# Patient Record
Sex: Female | Born: 1985 | Race: Black or African American | Hispanic: No | Marital: Single | Smoking: Current every day smoker
Health system: Southern US, Community
[De-identification: ages and names within clinical notes are randomized; demographics above are authoritative.]

## PROBLEM LIST (undated history)

## (undated) DIAGNOSIS — I509 Heart failure, unspecified: Secondary | ICD-10-CM

## (undated) DIAGNOSIS — G4730 Sleep apnea, unspecified: Secondary | ICD-10-CM

## (undated) DIAGNOSIS — I10 Essential (primary) hypertension: Secondary | ICD-10-CM

---

## 2009-12-05 ENCOUNTER — Emergency Department (HOSPITAL_COMMUNITY): Admission: EM | Admit: 2009-12-05 | Discharge: 2009-12-05 | Payer: Self-pay | Admitting: Emergency Medicine

## 2010-04-27 ENCOUNTER — Inpatient Hospital Stay (HOSPITAL_COMMUNITY): Admission: EM | Admit: 2010-04-27 | Discharge: 2010-04-30 | Payer: Self-pay | Source: Home / Self Care

## 2010-04-28 LAB — URINALYSIS, ROUTINE W REFLEX MICROSCOPIC
Ketones, ur: NEGATIVE mg/dL
Protein, ur: NEGATIVE mg/dL
Urobilinogen, UA: 1 mg/dL (ref 0.0–1.0)

## 2010-04-28 LAB — WET PREP, GENITAL
Trich, Wet Prep: NONE SEEN
Yeast Wet Prep HPF POC: NONE SEEN

## 2010-04-28 LAB — DIFFERENTIAL
Basophils Absolute: 0.1 10*3/uL (ref 0.0–0.1)
Basophils Absolute: 0 10*3/uL (ref 0.0–0.1)
Basophils Relative: 0 % (ref 0–1)
Basophils Relative: 0 % (ref 0–1)
Basophils Relative: 0 % (ref 0–1)
Eosinophils Relative: 0 % (ref 0–5)
Eosinophils Relative: 1 % (ref 0–5)
Lymphocytes Relative: 5 % — ABNORMAL LOW (ref 12–46)
Lymphocytes Relative: 12 % (ref 12–46)
Lymphocytes Relative: 14 % (ref 12–46)
Monocytes Absolute: 1.9 10*3/uL — ABNORMAL HIGH (ref 0.1–1.0)
Monocytes Absolute: 1 10*3/uL (ref 0.1–1.0)
Monocytes Relative: 5 % (ref 3–12)
Neutro Abs: 24.5 10*3/uL — ABNORMAL HIGH (ref 1.7–7.7)
Neutro Abs: 17.9 10*3/uL — ABNORMAL HIGH (ref 1.7–7.7)
Neutrophils Relative %: 90 % — ABNORMAL HIGH (ref 43–77)
Neutrophils Relative %: 81 % — ABNORMAL HIGH (ref 43–77)
WBC Morphology: INCREASED

## 2010-04-28 LAB — COMPREHENSIVE METABOLIC PANEL
AST: 14 U/L (ref 0–37)
Alkaline Phosphatase: 52 U/L (ref 39–117)
BUN: 9 mg/dL (ref 6–23)
CO2: 26 mEq/L (ref 19–32)
Chloride: 99 mEq/L (ref 96–112)
Creatinine, Ser: 1.09 mg/dL (ref 0.4–1.2)
GFR calc non Af Amer: >60 mL/min (ref >60)
Potassium: 3.5 mEq/L (ref 3.5–5.1)
Total Bilirubin: 1.1 mg/dL (ref 0.3–1.2)

## 2010-04-28 LAB — HIV ANTIBODY (ROUTINE TESTING W REFLEX): HIV: NONREACTIVE

## 2010-04-28 LAB — BASIC METABOLIC PANEL
Calcium: 8.5 mg/dL (ref 8.4–10.5)
Creatinine, Ser: 0.99 mg/dL (ref 0.4–1.2)
GFR calc Af Amer: >60 mL/min (ref >60)
GFR calc non Af Amer: >60 mL/min (ref >60)
Sodium: 141 mEq/L (ref 135–145)

## 2010-04-28 LAB — CBC
Hemoglobin: 13.8 g/dL (ref 12.0–15.0)
Hemoglobin: 12.3 g/dL (ref 12.0–15.0)
Hemoglobin: 12.9 g/dL (ref 12.0–15.0)
MCH: 27.2 pg (ref 26.0–34.0)
MCH: 27.2 pg (ref 26.0–34.0)
MCHC: 33.9 g/dL (ref 30.0–36.0)
MCV: 79.5 fL (ref 78.0–100.0)
Platelets: 188 10*3/uL (ref 150–400)
RBC: 5.08 MIL/uL (ref 3.87–5.11)
RBC: 4.52 MIL/uL (ref 3.87–5.11)
RDW: 15 % (ref 11.5–15.5)
WBC: 26.5 10*3/uL — ABNORMAL HIGH (ref 4.0–10.5)
WBC: 22.4 10*3/uL — ABNORMAL HIGH (ref 4.0–10.5)

## 2010-04-28 LAB — PREGNANCY, URINE: Preg Test, Ur: NEGATIVE

## 2010-04-28 LAB — GLUCOSE, CAPILLARY: Glucose-Capillary: 109 mg/dL — ABNORMAL HIGH (ref 70–99)

## 2010-04-28 LAB — URINE MICROSCOPIC-ADD ON

## 2010-04-29 LAB — DIFFERENTIAL
Basophils Relative: 0 % (ref 0–1)
Eosinophils Absolute: 0.4 10*3/uL (ref 0.0–0.7)
Neutro Abs: 14.1 10*3/uL — ABNORMAL HIGH (ref 1.7–7.7)
Neutrophils Relative %: 76 % (ref 43–77)

## 2010-04-29 LAB — GC/CHLAMYDIA PROBE AMP, GENITAL
Chlamydia, DNA Probe: POSITIVE — AB
GC Probe Amp, Genital: POSITIVE — AB

## 2010-04-29 LAB — URINE CULTURE

## 2010-04-29 LAB — CBC
Hemoglobin: 12.3 g/dL (ref 12.0–15.0)
Platelets: 180 10*3/uL (ref 150–400)
RBC: 4.59 MIL/uL (ref 3.87–5.11)
WBC: 18.5 10*3/uL — ABNORMAL HIGH (ref 4.0–10.5)

## 2010-04-30 LAB — DIFFERENTIAL
Eosinophils Relative: 3 % (ref 0–5)
Lymphocytes Relative: 21 % (ref 12–46)
Lymphs Abs: 2.5 10*3/uL (ref 0.7–4.0)
Monocytes Absolute: 0.8 10*3/uL (ref 0.1–1.0)
Monocytes Relative: 7 % (ref 3–12)

## 2010-04-30 LAB — CBC
Platelets: 201 10*3/uL (ref 150–400)
RBC: 4.53 MIL/uL (ref 3.87–5.11)
RDW: 14.9 % (ref 11.5–15.5)
WBC: 11.9 10*3/uL — ABNORMAL HIGH (ref 4.0–10.5)

## 2010-05-02 LAB — CULTURE, BLOOD (ROUTINE X 2): Culture: NO GROWTH

## 2010-05-08 NOTE — Discharge Summary (Signed)
NAMECRISSY, Samantha Mccarthy NO.:  [id number]  MEDICAL RECORD NO.:  [id number]          PATIENT TYPE:  INP  LOCATION:  A305                          FACILITY:  [facility]  PHYSICIAN:  Pleas Koch, MD        DATE OF BIRTH:  Dec 24, 1985  DATE OF ADMISSION:  04/27/2010 DATE OF DISCHARGE:  01/27/2012LH                              DISCHARGE SUMMARY   PRIMARY CARE PHYSICIAN:  [facility] Department.  DISCHARGE DIAGNOSES: 1. Gonococcal and chlamydial cervicitis. 2. Hypertension. 3. Morbid obesity. 4. Metabolic syndrome. 5. Uncontrolled hypertension.  DISCHARGE MEDICATIONS:  Remains same and are as follows: 1. Aleve 220 mg 1 tablet p.o. p.r.n. 2. Ibuprofen 800 mg 1 tablet q.6-8 p.r.n. for pain. 3. Lisinopril doses changed from 10-20 mg p.o. daily.  BRIEF HISTORY OF PRESENT COMPLAINTS:  The patient is a 25 year old African American female, morbidly obese, history of hypertension who came to the [facility] when she noted 3 days of lower right abdominal pain, pain was 7-8 in intensity on 10 associated with fever and chills.  She had multiple episodes of emesis with liquid emesis.  No blood.  Denies any diarrhea.  Denies any dysuria.  Did not admit any vaginal discharge.  She has occasional cough, did not radiate anywhere. She did have unprotected sex weekend before admission.  The patient stated to admitting physician in the past that she has had prior SDI.  Smokes 2-3 cigarettes on a daily basis.  No alcohol use, currently is unemployed.  It was noted on exam initially that she had tenderness in the abdomen, more in right lower quadrant.  No rebound, guarding, or rigidity. Pelvic done by ED physician showed a lot of pus in the cervix.  Dr. Deretha Emory count not elicit cervical motion tenderness.  The patient received cefoxitin 1 g in the ED and then 3 separate doses after that. The patient was started on cefoxitin and doxycycline as  previously mentioned.  Wet prep was done, which showed few Trichomonas.  Her gonorrhea, Chlamydia came back as positive and her HIV test that was done came back negative.  Hypertension:  This was moderately controlled while in hospital; however, she became acutely upset on the day prior to discharge and needed labetalol x1.  As such, we have increased her medications from lisinopril 10 to lisinopril 20 p.o. daily.  The patient is understanding of same and will take this.  Obstructive sleep apnea:  On the day of discharge, the patient was noted to be snoring.  May need an outpatient followup for sleep study.  I will have [facility] Department update this.  Morbid obesity:  She will need to be counseled about nutrition, since this may prevent her from having such high blood pressure and obstructive sleep apnea.  Her white count was initially elevated at around 30; however, trended down from day of admission to 11.9 on the day of discharge.  VITALS ON DISCHARGE:  Temperature 97.8, pulse 80, respirations 16, blood pressures 179-180/118-133.  She was satting to 97% on room air and did not have any significant complaints.  Please note that Health Department  will need to be notified about her gonorrhea and Chlamydia positive state.                                           ______________________________ Pleas Koch, MD     JS/MEDQ  D:  04/30/2010  T:  04/30/2010  Job:  [id number]  Electronically Signed by Pleas Koch MD on 05/08/2010 06:41:29 PM

## 2010-05-09 NOTE — H&P (Signed)
Samantha Mccarthy, NEGRETTE NO.:  [id number]  MEDICAL RECORD NO.:  [id number]          PATIENT TYPE:  EMS  LOCATION:  ED                            FACILITY:  [facility]  PHYSICIAN:  Osvaldo Shipper, MD     DATE OF BIRTH:  Jan 28, 1986  DATE OF ADMISSION:  04/27/2010 DATE OF DISCHARGE:  LH                             HISTORY & PHYSICAL   PRIMARY CARE PHYSICIAN:  A physician at the [facility] Department, although she has not seen her providers in over a year.  ADMISSION DIAGNOSES: 1. Pelvic inflammatory disease. 2. Morbid obesity. 3. History of hypertension.  CHIEF COMPLAINT:  Abdominal pain, nausea and vomiting since 3 days.  HISTORY OF PRESENT ILLNESS:  The patient is a 25 year old African American female who is morbidly obese who has a history of hypertension, who was in her usual state of health until about 3 days ago when she started noticing pain in the lower part of her abdomen towards the rightside.  The pain was 7-8/10 in intensity, associated with fever, chills. She had multiple episodes of emesis with liquid emesis.  No blood. Denies any diarrhea.  Denies any dysuria.  She did not admit to any vaginal discharge to me.  She said she her friend's baby was sick with fever recently.  She does get occasional cough which is dry.  The pain did not really radiate anywhere, stays in the right lower quadrant. There were no precipitating, aggravating or relieving factors identified.  She did tell me that over the last weekend, she had a sexual encounter and had unprotected sex.  MEDICATIONS AT HOME:  Lisinopril 10 mg daily.  ALLERGIES:  Include CODEINE, LORTAB, and TYLENOL which cause hives, rashes, swelling, and itching.  PAST MEDICAL HISTORY:  Positive for hypertension.  She is also morbidly obese.  She said she had similar complaints when she was 25 years old, which was treated with amoxicillin and some pain medicine.  She admits to having unprotected  sex once in awhile, although she cannot remember the previous encounter prior to the last one.  SOCIAL HISTORY:  She lives in [place] with her aunt.  She has never been married, does not have any children.  Smokes 2-3 cigarettes on a daily basis.  No alcohol use.  No illicit drug use.  She is currently unemployed.  FAMILY HISTORY:  Father has some heart disease, which is unknown. Mother died of unknown cancer.  There is also a history of diabetes and hypertension in the family.  REVIEW OF SYSTEMS:  GENERAL: Positive for weakness, malaise.  HEENT: Unremarkable.  CARDIOVASCULAR:  Unremarkable.  RESPIRATORY:  As in HPI. GI: As in HPI.  GU: As in HPI.  NEUROLOGIC: Unremarkable.  PSYCHIATRIC: Unremarkable.  DERMATOLOGIC:  Unremarkable.  Other systems reviewed and found to be negative.  PHYSICAL EXAMINATION:  VITAL SIGNS: Temperature initially here was 100.1, subsequently has been 99.4, blood pressure 143/82.  Heart rate initially was 105, subsequently 86, respiratory rate 20, saturation 97% on room air. GENERAL:  A morbidly obese African American female in no distress. HEENT: Head is normocephalic, atraumatic.  Pupils are equal, reacting.  No pallor, no icterus.  Oral mucous membranes are moist.  There are no oral lesions noted. NECK:  Soft and supple.  No thyromegaly is appreciated.  No cervical, supraclavicular, or inguinal lymphadenopathy is present. LUNGS:  Anteriorly clear to auscultation bilaterally with no wheezing, rales or rhonchi. CARDIOVASCULAR:  S1 and S2 is normal.  Regular.  No S3-S4, rubs, murmurs, or bruits.  No pedal edema.  ABDOMEN:  Soft, obese.  There is tenderness in the lower abdomen, more in the right lower quadrant as well as in the suprapubic area.  There is no rebound, rigidity, or guarding. PELVIC:  Done by the ED physician, which showed a lot of pus coming from her cervix.  Dr. Deretha Emory could not elicit cervical motion tenderness. However, the patient was  already medicated by that time. MUSCULOSKELETAL:  Normal muscle mass and tone. NEUROLOGICALLY:  She is alert, somewhat drowsy from the pain medicine but easily arousable without any focal neurological deficits.  No skin rashes are present.  LAB DATA:  Sodium is 134, potassium 3.5, glucose 108, BUN and creatinine normal.  LFTs are normal.  Lipase is normal.  White cell count was 26,500 with 90% neutrophils, hemoglobin 13.8, platelet count 201.  UA did not show any infection.  A urine pregnancy test was negative.  She had a chest x-ray which showed borderline cardiomegaly without any active disease.  A CAT scan of the abdomen/pelvis was also done which showed nonspecific mild stranding of the fat plains in the anterior pelvis of uncertain etiology.  Some scattered normal/upper-normal size mesenteric and pelvic lymph nodes were also seen.  No other acute process was identified.  ASSESSMENT:  This is 25 year old African American female who is morbidly obese, has hypertension, who presents with abdominal pain, fever, and leukocytosis.  It appears the patient may have pelvic inflammatory disease.  She has been having a lot of vomiting and nausea, so she needs to be admitted at least initially to get IV antibiotics and to control her symptoms.  PLAN: 1. Pelvic inflammatory disease based on [facility] Antibiotic     Guide.  The inpatient treatment for this consists of cefoxitin     intravenously and doxycycline either intravenously or orally.  We     will initiate these treatments.  The wet screen has been done, and     cultures from the pelvic area will be sent off and will be followed     up on.  I have gone over the perils of unprotected sex with this     patient.  She did tell me that she has had HIV testing done in the     past, and it has been negative.  Because she has had unprotected     sex, I am going to order another HIV test here.  The patient may     benefit from seeing  her gynecologist as an outpatient, which can be     explained to her when she is ready for discharge. 2. We will give her Motrin for fever as needed.  Give her analgesics     narcotics as well.  Labs will be rechecked in the morning to make     sure the white cell count is improving. 3. Nausea, vomiting is likely from her acute illness and will be     treated symptomatically.  Further management decisions will depend on results of further testing and patient's response to treatment.  Osvaldo Shipper, MD     GK/MEDQ  D:  04/27/2010  T:  04/27/2010  Job:  [id number]  cc:   [facility] Dept.  Electronically Signed by Osvaldo Shipper MD on 05/09/2010 05:07:57 PM

## 2010-06-17 LAB — BASIC METABOLIC PANEL
Chloride: 104 mEq/L (ref 96–112)
GFR calc non Af Amer: >60 mL/min (ref >60)
Potassium: 3.5 mEq/L (ref 3.5–5.1)
Sodium: 137 mEq/L (ref 135–145)

## 2010-06-17 LAB — URINALYSIS, ROUTINE W REFLEX MICROSCOPIC
Bilirubin Urine: NEGATIVE
Glucose, UA: NEGATIVE mg/dL
Leukocytes, UA: NEGATIVE
Specific Gravity, Urine: 1.025 (ref 1.005–1.030)
Urobilinogen, UA: 0.2 mg/dL (ref 0.0–1.0)
pH: 5.5 (ref 5.0–8.0)

## 2010-06-17 LAB — CBC
HCT: 37.6 % (ref 36.0–46.0)
Hemoglobin: 12.3 g/dL (ref 12.0–15.0)
MCV: 82 fL (ref 78.0–100.0)
RBC: 4.59 MIL/uL (ref 3.87–5.11)
WBC: 9.1 10*3/uL (ref 4.0–10.5)

## 2010-06-17 LAB — DIFFERENTIAL
Basophils Absolute: 0.1 10*3/uL (ref 0.0–0.1)
Lymphocytes Relative: 35 % (ref 12–46)
Lymphs Abs: 3.2 10*3/uL (ref 0.7–4.0)
Monocytes Absolute: 0.4 10*3/uL (ref 0.1–1.0)
Monocytes Relative: 5 % (ref 3–12)
Neutro Abs: 5.1 10*3/uL (ref 1.7–7.7)

## 2010-06-17 LAB — PREGNANCY, URINE: Preg Test, Ur: NEGATIVE

## 2010-06-17 LAB — URINE MICROSCOPIC-ADD ON

## 2011-02-09 ENCOUNTER — Encounter: Payer: Self-pay | Admitting: Emergency Medicine

## 2011-02-09 ENCOUNTER — Emergency Department (HOSPITAL_COMMUNITY)
Admission: EM | Admit: 2011-02-09 | Discharge: 2011-02-09 | Disposition: A | Discharge status: Home / Self Care | Payer: Medicaid Other | Attending: Emergency Medicine | Admitting: Emergency Medicine

## 2011-02-09 DIAGNOSIS — Z91199 Patient's noncompliance with other medical treatment and regimen due to unspecified reason: Secondary | ICD-10-CM | POA: Insufficient documentation

## 2011-02-09 DIAGNOSIS — I10 Essential (primary) hypertension: Secondary | ICD-10-CM | POA: Insufficient documentation

## 2011-02-09 DIAGNOSIS — L259 Unspecified contact dermatitis, unspecified cause: Secondary | ICD-10-CM | POA: Insufficient documentation

## 2011-02-09 DIAGNOSIS — Z9114 Patient's other noncompliance with medication regimen: Secondary | ICD-10-CM

## 2011-02-09 DIAGNOSIS — R21 Rash and other nonspecific skin eruption: Secondary | ICD-10-CM | POA: Insufficient documentation

## 2011-02-09 DIAGNOSIS — Z9119 Patient's noncompliance with other medical treatment and regimen: Secondary | ICD-10-CM | POA: Insufficient documentation

## 2011-02-09 DIAGNOSIS — L0100 Impetigo, unspecified: Secondary | ICD-10-CM

## 2011-02-09 DIAGNOSIS — L239 Allergic contact dermatitis, unspecified cause: Secondary | ICD-10-CM

## 2011-02-09 HISTORY — DX: Essential (primary) hypertension: I10

## 2011-02-09 LAB — POCT PREGNANCY, URINE: Preg Test, Ur: NEGATIVE

## 2011-02-09 MED ORDER — DEXAMETHASONE SODIUM PHOSPHATE 10 MG/ML IJ SOLN
10.0000 mg | Freq: Once | INTRAMUSCULAR | Status: AC
  Administered 2011-02-09: 10 mg via INTRAMUSCULAR

## 2011-02-09 MED ORDER — PREDNISONE 10 MG PO TABS: ORAL_TABLET | ORAL | Status: DC

## 2011-02-09 MED ORDER — DIPHENHYDRAMINE HCL 50 MG PO CAPS: 50.0000 mg | ORAL_CAPSULE | Freq: Four times a day (QID) | ORAL | Status: AC | PRN

## 2011-02-09 MED ORDER — CEPHALEXIN 500 MG PO CAPS: 500.0000 mg | ORAL_CAPSULE | Freq: Four times a day (QID) | ORAL | Status: AC

## 2011-02-09 MED ORDER — DEXAMETHASONE SODIUM PHOSPHATE 10 MG/ML IJ SOLN
10.0000 mg | Freq: Once | INTRAMUSCULAR | Status: DC
  Filled 2011-02-09: qty 1

## 2011-02-09 MED ORDER — DIPHENHYDRAMINE HCL 25 MG PO CAPS
50.0000 mg | ORAL_CAPSULE | Freq: Once | ORAL | Status: AC
  Administered 2011-02-09: 50 mg via ORAL
  Filled 2011-02-09: qty 2

## 2011-02-09 MED ORDER — CEPHALEXIN 500 MG PO CAPS
500.0000 mg | ORAL_CAPSULE | Freq: Once | ORAL | Status: AC
  Administered 2011-02-09: 500 mg via ORAL
  Filled 2011-02-09: qty 1

## 2011-02-09 MED ORDER — LISINOPRIL 10 MG PO TABS: 10.0000 mg | ORAL_TABLET | Freq: Every day | ORAL | Status: DC

## 2011-02-09 NOTE — ED Provider Notes (Signed)
History     CSN: [id number] Arrival date & time: 02/09/2011  8:41 AM   First MD Initiated Contact with Patient 02/09/11 [phone]      Chief Complaint  Patient presents with  . Rash    (Consider location/radiation/quality/duration/timing/severity/associated sxs/prior treatment) HPI Comments: Patient also reports ran out of her lisinopril about 2 months ago.  Has seen [facility] for medical care,  But has not followed up with them.  She denies dizziness,  Headache,  Sob,  Chest pain.  Patient is a 25 y.o. female presenting with rash. The history is provided by the patient.  Rash  This is a new problem. Episode onset: 1 week. The problem has been gradually worsening. The problem is associated with a new detergent/soap (She switched from dove unscented to dial last week,  has since switched back when the rash started.). There has been no fever. The rash is present on the groin, left buttock and right buttock (right face and neck.  Wrists and forearm,  few scattered patches on abdomen and back.). The pain is at a severity of 0/10. The patient is experiencing no pain. The pain has been constant since onset. Associated symptoms include itching and weeping. She has tried nothing for the symptoms.    Past Medical History  Diagnosis Date  . Hypertension     History reviewed. No pertinent past surgical history.  Family History  Problem Relation Age of Onset  . Cancer Mother   . Heart failure Father   . Diabetes Other     History  Substance Use Topics  . Smoking status: Current Everyday Smoker -- 0.5 packs/day for 7 years    Types: Cigarettes  . Smokeless tobacco: Never Used  . Alcohol Use: Yes     Occasionally    OB History    Grav Para Term Preterm Abortions TAB SAB Ect Mult Living            0      Review of Systems  Constitutional: Negative for fever.  HENT: Negative for congestion, sore throat and neck pain.   Eyes: Negative.   Respiratory: Negative for chest tightness  and shortness of breath.   Cardiovascular: Negative for chest pain.  Gastrointestinal: Negative for nausea and abdominal pain.  Genitourinary: Negative.   Musculoskeletal: Negative for joint swelling and arthralgias.  Skin: Positive for itching and rash. Negative for wound.  Neurological: Negative for dizziness, weakness, light-headedness, numbness and headaches.  Hematological: Negative.   Psychiatric/Behavioral: Negative.     Allergies  Codeine; Hydrocodone-acetaminophen; and Tylenol  Home Medications   Current Outpatient Rx  Name Route Sig Dispense Refill  . IBUPROFEN 200 MG PO TABS Oral Take 800 mg by mouth every 6 (six) hours as needed. For pain       BP 173/127  Pulse 77  Temp(Src) 98.8 F (37.1 C) (Oral)  Resp 16  Ht 5\' 6"  (1.676 m)  Wt 350 lb (158.759 kg)  BMI 56.49 kg/m2  SpO2 100%  LMP 01/02/2011  Physical Exam  Nursing note and vitals reviewed. Constitutional: She is oriented to person, place, and time.       Morbidly obese  HENT:  Head: Normocephalic and atraumatic.  Eyes: Conjunctivae are normal.  Neck: Normal range of motion.  Cardiovascular: Normal rate, regular rhythm, normal heart sounds and intact distal pulses.   Pulmonary/Chest: Effort normal and breath sounds normal. She has no wheezes.  Abdominal: Soft. Bowel sounds are normal. There is no tenderness.  Musculoskeletal: Normal  range of motion.  Neurological: She is alert and oriented to person, place, and time.  Skin: Skin is warm and dry. Rash noted. Rash is maculopapular.       Scattered,  Excoriated maculopapular patches on buttocks, more linear on posterior buttock folds,  Anterior groin,  Bilateral wrists and right cheek and face.  Clear yellow drainage from buttock and groin lesions.  No surrounding erythema.  Significant excoriations noted.  Psychiatric: She has a normal mood and affect.    ED Course  Procedures (including critical care time)  Labs Reviewed - No data to display No  results found.   No diagnosis found.    MDM  Allergic dermatitis with superimposed impetigo.  Hypertension with noncompliance.  Prednisone taper,  Benadryl,  Keflex,  Refilled lisinopril.  Patient strongly encouraged to f/u with health dept for bp issues,  Primary care.          Candis Musa, PA 02/09/11 1000

## 2011-02-09 NOTE — ED Notes (Signed)
Patient c/o rash to face, groin,  and legs x1 week. Patient reports rash itching. Patient reports bleeding and yellow drainage after scratching areas.

## 2011-02-10 NOTE — ED Provider Notes (Signed)
Medical screening examination/treatment/procedure(s) were performed by non-physician practitioner and as supervising physician I was immediately available for consultation/collaboration.  Tarez Bowns S. Eulanda Dorion, MD 02/10/11 0720 

## 2013-06-30 ENCOUNTER — Emergency Department (HOSPITAL_COMMUNITY)
Admission: EM | Admit: 2013-06-30 | Discharge: 2013-06-30 | Disposition: A | Discharge status: Home / Self Care | Payer: Self-pay | Attending: Emergency Medicine | Admitting: Emergency Medicine

## 2013-06-30 ENCOUNTER — Emergency Department (HOSPITAL_COMMUNITY): Payer: Medicaid Other

## 2013-06-30 ENCOUNTER — Encounter (HOSPITAL_COMMUNITY): Payer: Self-pay | Admitting: Emergency Medicine

## 2013-06-30 DIAGNOSIS — I10 Essential (primary) hypertension: Secondary | ICD-10-CM | POA: Insufficient documentation

## 2013-06-30 DIAGNOSIS — R05 Cough: Secondary | ICD-10-CM | POA: Insufficient documentation

## 2013-06-30 DIAGNOSIS — Z79899 Other long term (current) drug therapy: Secondary | ICD-10-CM | POA: Insufficient documentation

## 2013-06-30 DIAGNOSIS — N390 Urinary tract infection, site not specified: Secondary | ICD-10-CM | POA: Insufficient documentation

## 2013-06-30 DIAGNOSIS — F17200 Nicotine dependence, unspecified, uncomplicated: Secondary | ICD-10-CM | POA: Insufficient documentation

## 2013-06-30 DIAGNOSIS — R059 Cough, unspecified: Secondary | ICD-10-CM | POA: Insufficient documentation

## 2013-06-30 DIAGNOSIS — Z3202 Encounter for pregnancy test, result negative: Secondary | ICD-10-CM | POA: Insufficient documentation

## 2013-06-30 LAB — CBC WITH DIFFERENTIAL/PLATELET
BAND NEUTROPHILS: 0 % (ref 0–10)
BASOS ABS: 0 10*3/uL (ref 0.0–0.1)
BASOS PCT: 0 % (ref 0–1)
Blasts: 0 %
Eosinophils Absolute: 0 10*3/uL (ref 0.0–0.7)
Eosinophils Relative: 0 % (ref 0–5)
HEMATOCRIT: 38.8 % (ref 36.0–46.0)
HEMOGLOBIN: 12.5 g/dL (ref 12.0–15.0)
Lymphocytes Relative: 52 % — ABNORMAL HIGH (ref 12–46)
Lymphs Abs: 4.7 10*3/uL — ABNORMAL HIGH (ref 0.7–4.0)
MCH: 25.8 pg — AB (ref 26.0–34.0)
MCHC: 32.2 g/dL (ref 30.0–36.0)
MCV: 80.2 fL (ref 78.0–100.0)
MYELOCYTES: 0 %
Metamyelocytes Relative: 0 %
Monocytes Absolute: 0.1 10*3/uL (ref 0.1–1.0)
Monocytes Relative: 1 % — ABNORMAL LOW (ref 3–12)
NEUTROS PCT: 47 % (ref 43–77)
Neutro Abs: 4.2 10*3/uL (ref 1.7–7.7)
PROMYELOCYTES ABS: 0 %
Platelets: 219 10*3/uL (ref 150–400)
RBC: 4.84 MIL/uL (ref 3.87–5.11)
RDW: 15 % (ref 11.5–15.5)
WBC: 9 10*3/uL (ref 4.0–10.5)
nRBC: 0 /100 WBC

## 2013-06-30 LAB — COMPREHENSIVE METABOLIC PANEL
ALBUMIN: 3 g/dL — AB (ref 3.5–5.2)
ALK PHOS: 60 U/L (ref 39–117)
ALT: 13 U/L (ref 0–35)
AST: 14 U/L (ref 0–37)
BILIRUBIN TOTAL: 0.5 mg/dL (ref 0.3–1.2)
BUN: 11 mg/dL (ref 6–23)
CHLORIDE: 101 meq/L (ref 96–112)
CO2: 28 mEq/L (ref 19–32)
Calcium: 9 mg/dL (ref 8.4–10.5)
Creatinine, Ser: 1.19 mg/dL — ABNORMAL HIGH (ref 0.50–1.10)
GFR calc Af Amer: 71 mL/min — ABNORMAL LOW (ref >90)
GFR calc non Af Amer: 62 mL/min — ABNORMAL LOW (ref >90)
Glucose, Bld: 97 mg/dL (ref 70–99)
POTASSIUM: 3.2 meq/L — AB (ref 3.7–5.3)
Sodium: 138 mEq/L (ref 137–147)
Total Protein: 7.5 g/dL (ref 6.0–8.3)

## 2013-06-30 LAB — URINE MICROSCOPIC-ADD ON

## 2013-06-30 LAB — URINALYSIS, ROUTINE W REFLEX MICROSCOPIC
Bilirubin Urine: NEGATIVE
GLUCOSE, UA: NEGATIVE mg/dL
Ketones, ur: NEGATIVE mg/dL
Nitrite: POSITIVE — AB
Specific Gravity, Urine: 1.025 (ref 1.005–1.030)
Urobilinogen, UA: 1 mg/dL (ref 0.0–1.0)
pH: 5.5 (ref 5.0–8.0)

## 2013-06-30 LAB — WET PREP, GENITAL
CLUE CELLS WET PREP: NONE SEEN
Trich, Wet Prep: NONE SEEN
Yeast Wet Prep HPF POC: NONE SEEN

## 2013-06-30 LAB — PREGNANCY, URINE: Preg Test, Ur: NEGATIVE

## 2013-06-30 LAB — LIPASE, BLOOD: Lipase: 19 U/L (ref 11–59)

## 2013-06-30 MED ORDER — IOHEXOL 300 MG/ML  SOLN
100.0000 mL | Freq: Once | INTRAMUSCULAR | Status: AC | PRN
  Administered 2013-06-30: 100 mL via INTRAVENOUS

## 2013-06-30 MED ORDER — CIPROFLOXACIN HCL 500 MG PO TABS: 500.0000 mg | ORAL_TABLET | Freq: Two times a day (BID) | ORAL | Status: DC

## 2013-06-30 MED ORDER — FENTANYL CITRATE 0.05 MG/ML IJ SOLN
50.0000 ug | Freq: Once | INTRAMUSCULAR | Status: AC
  Administered 2013-06-30: 50 ug via INTRAVENOUS
  Filled 2013-06-30: qty 2

## 2013-06-30 MED ORDER — TRAMADOL HCL 50 MG PO TABS: 50.0000 mg | ORAL_TABLET | Freq: Four times a day (QID) | ORAL | Status: DC | PRN

## 2013-06-30 MED ORDER — IOHEXOL 300 MG/ML  SOLN
50.0000 mL | Freq: Once | INTRAMUSCULAR | Status: AC | PRN
  Administered 2013-06-30: 50 mL via ORAL

## 2013-06-30 MED ORDER — SODIUM CHLORIDE 0.9 % IV BOLUS (SEPSIS)
1000.0000 mL | Freq: Once | INTRAVENOUS | Status: AC
  Administered 2013-06-30: 1000 mL via INTRAVENOUS

## 2013-06-30 MED ORDER — PROMETHAZINE HCL 25 MG PO TABS: 25.0000 mg | ORAL_TABLET | Freq: Four times a day (QID) | ORAL | Status: DC | PRN

## 2013-06-30 MED ORDER — ONDANSETRON HCL 4 MG/2ML IJ SOLN
4.0000 mg | Freq: Once | INTRAMUSCULAR | Status: AC
  Administered 2013-06-30: 4 mg via INTRAVENOUS
  Filled 2013-06-30: qty 2

## 2013-06-30 MED ORDER — DEXTROSE 5 % IV SOLN
1.0000 g | Freq: Once | INTRAVENOUS | Status: AC
  Administered 2013-06-30: 1 g via INTRAVENOUS
  Filled 2013-06-30: qty 10

## 2013-06-30 NOTE — ED Provider Notes (Signed)
CSN: [id number]     Arrival date & time 06/30/13  1623 History  This chart was scribed for Samantha Hutching, MD by Blanchard Kelch, ED Scribe. The patient was seen in room APA08/APA08. Patient's care was started at 4:54 PM.    Chief Complaint  Patient presents with  . Abdominal Pain     The history is provided by the patient. No language interpreter was used.    HPI Comments: Samantha Mccarthy is a 28 y.o. female who presents to the Emergency Department complaining of constant, right-sided lower abdominal pain that began yesterday. The pain radiates to her back. She describes the pain as shooting and rates it as a 9/10 in severity. She also reports a cough, which worsens the pain. She denies dysuria, irregular vaginal bleeding or vaginal discharge. She is sexually active. She denies using birth control. She is currently on her menstrual cycle but denies the pain feels similar to pain associated with it. She denies a history of kidney stones or ovarian cysts.   Past Medical History  Diagnosis Date  . Hypertension    History reviewed. No pertinent past surgical history. Family History  Problem Relation Age of Onset  . Cancer Mother   . Heart failure Father   . Diabetes Other    History  Substance Use Topics  . Smoking status: Current Every Day Smoker -- 0.50 packs/day for 7 years    Types: Cigarettes  . Smokeless tobacco: Never Used  . Alcohol Use: Yes     Comment: Occasionally   OB History   Grav Para Term Preterm Abortions TAB SAB Ect Mult Living            0     Review of Systems A complete 10 system review of systems was obtained and all systems are negative except as noted in the HPI and PMH.     Allergies  Codeine; Hydrocodone-acetaminophen; and Tylenol  Home Medications   Current Outpatient Rx  Name  Route  Sig  Dispense  Refill  . ibuprofen (ADVIL,MOTRIN) 200 MG tablet   Oral   Take 800 mg by mouth every 6 (six) hours as needed. For pain          . lisinopril  (PRINIVIL,ZESTRIL) 10 MG tablet   Oral   Take 10 mg by mouth daily.           . ciprofloxacin (CIPRO) 500 MG tablet   Oral   Take 1 tablet (500 mg total) by mouth 2 (two) times daily. One po bid x 7 days   14 tablet   0   . promethazine (PHENERGAN) 25 MG tablet   Oral   Take 1 tablet (25 mg total) by mouth every 6 (six) hours as needed.   15 tablet   0   . traMADol (ULTRAM) 50 MG tablet   Oral   Take 1 tablet (50 mg total) by mouth every 6 (six) hours as needed.   20 tablet   0    Triage Vitals: BP 165/103  Pulse 96  Temp(Src) 98.2 F (36.8 C) (Oral)  Resp 20  Ht 5\' 7"  (1.702 m)  Wt 360 lb (163.295 kg)  BMI 56.37 kg/m2  SpO2 99%  LMP 06/26/2013  Physical Exam  Nursing note and vitals reviewed. Constitutional: She is oriented to person, place, and time. She appears well-developed and well-nourished.  HENT:  Head: Normocephalic and atraumatic.  Eyes: Conjunctivae and EOM are normal. Pupils are equal, round, and reactive to  light.  Neck: Normal range of motion. Neck supple.  Cardiovascular: Normal rate, regular rhythm and normal heart sounds.   Pulmonary/Chest: Effort normal and breath sounds normal.  Abdominal: Soft. Bowel sounds are normal.  Minimal tenderness to right abdomen.   Genitourinary:  Pelvic exam. External genitalia normal. No cervical motion tenderness. No cervical discharge. Minimal right adnexal tenderness  Musculoskeletal: Normal range of motion.  Neurological: She is alert and oriented to person, place, and time.  Skin: Skin is warm and dry.  Psychiatric: She has a normal mood and affect. Her behavior is normal.    ED Course  Procedures (including critical care time)  DIAGNOSTIC STUDIES: Oxygen Saturation is 99% on room air, normal by my interpretation.    COORDINATION OF CARE: 4:48 PM -Will perform pelvic exam and order genital wet prep, GC/Chlamydia, CBC, CMP, blood lipase, UA and Pregnancy Urine labs.. Ordered pain medication and  anitemetic. Patient verbalizes understanding and agrees with treatment plan.  Results for orders placed during the hospital encounter of 06/30/13  WET PREP, GENITAL      Result Value Ref Range   Yeast Wet Prep HPF POC NONE SEEN  NONE SEEN   Trich, Wet Prep NONE SEEN  NONE SEEN   Clue Cells Wet Prep HPF POC NONE SEEN  NONE SEEN   WBC, Wet Prep HPF POC FEW (*) NONE SEEN  CBC WITH DIFFERENTIAL      Result Value Ref Range   WBC 9.0  4.0 - 10.5 K/uL   RBC 4.84  3.87 - 5.11 MIL/uL   Hemoglobin 12.5  12.0 - 15.0 g/dL   HCT 16.1  09.6 - 04.5 %   MCV 80.2  78.0 - 100.0 fL   MCH 25.8 (*) 26.0 - 34.0 pg   MCHC 32.2  30.0 - 36.0 g/dL   RDW 40.9  81.1 - 91.4 %   Platelets 219  150 - 400 K/uL   Neutrophils Relative % 47  43 - 77 %   Lymphocytes Relative 52 (*) 12 - 46 %   Monocytes Relative 1 (*) 3 - 12 %   Eosinophils Relative 0  0 - 5 %   Basophils Relative 0  0 - 1 %   Band Neutrophils 0  0 - 10 %   Metamyelocytes Relative 0     Myelocytes 0     Promyelocytes Absolute 0     Blasts 0     nRBC 0  0 /100 WBC   Neutro Abs 4.2  1.7 - 7.7 K/uL   Lymphs Abs 4.7 (*) 0.7 - 4.0 K/uL   Monocytes Absolute 0.1  0.1 - 1.0 K/uL   Eosinophils Absolute 0.0  0.0 - 0.7 K/uL   Basophils Absolute 0.0  0.0 - 0.1 K/uL   WBC Morphology ATYPICAL LYMPHOCYTES    COMPREHENSIVE METABOLIC PANEL      Result Value Ref Range   Sodium 138  137 - 147 mEq/L   Potassium 3.2 (*) 3.7 - 5.3 mEq/L   Chloride 101  96 - 112 mEq/L   CO2 28  19 - 32 mEq/L   Glucose, Bld 97  70 - 99 mg/dL   BUN 11  6 - 23 mg/dL   Creatinine, Ser 7.82 (*) 0.50 - 1.10 mg/dL   Calcium 9.0  8.4 - 95.6 mg/dL   Total Protein 7.5  6.0 - 8.3 g/dL   Albumin 3.0 (*) 3.5 - 5.2 g/dL   AST 14  0 - 37 U/L   ALT  13  0 - 35 U/L   Alkaline Phosphatase 60  39 - 117 U/L   Total Bilirubin 0.5  0.3 - 1.2 mg/dL   GFR calc non Af Amer 62 (*) >90 mL/min   GFR calc Af Amer 71 (*) >90 mL/min  LIPASE, BLOOD      Result Value Ref Range   Lipase 19  11 - 59 U/L   URINALYSIS, ROUTINE W REFLEX MICROSCOPIC      Result Value Ref Range   Color, Urine YELLOW  YELLOW   APPearance CLOUDY (*) CLEAR   Specific Gravity, Urine 1.025  1.005 - 1.030   pH 5.5  5.0 - 8.0   Glucose, UA NEGATIVE  NEGATIVE mg/dL   Hgb urine dipstick MODERATE (*) NEGATIVE   Bilirubin Urine NEGATIVE  NEGATIVE   Ketones, ur NEGATIVE  NEGATIVE mg/dL   Protein, ur TRACE (*) NEGATIVE mg/dL   Urobilinogen, UA 1.0  0.0 - 1.0 mg/dL   Nitrite POSITIVE (*) NEGATIVE   Leukocytes, UA TRACE (*) NEGATIVE  PREGNANCY, URINE      Result Value Ref Range   Preg Test, Ur NEGATIVE  NEGATIVE  URINE MICROSCOPIC-ADD ON      Result Value Ref Range   Squamous Epithelial / LPF FEW (*) RARE   WBC, UA TOO NUMEROUS TO COUNT  <3 WBC/hpf   RBC / HPF 21-50  <3 RBC/hpf   Bacteria, UA MANY (*) RARE     Ct Abdomen Pelvis W Contrast  06/30/2013   CLINICAL DATA:  Right lower quadrant abdominal pain radiating to the back.  EXAM: CT ABDOMEN AND PELVIS WITH CONTRAST  TECHNIQUE: Multidetector CT imaging of the abdomen and pelvis was performed using the standard protocol following bolus administration of intravenous contrast.  CONTRAST:  50mL OMNIPAQUE IOHEXOL 300 MG/ML SOLN, 100mL OMNIPAQUE IOHEXOL 300 MG/ML SOLN  COMPARISON:  04/27/2010.  FINDINGS: A previously demonstrated small right renal cyst is unchanged. Normal appearing liver, spleen, pancreas, gallbladder, adrenal glands, left kidney, urinary bladder, uterus and ovaries. No urinary tract calculi or hydronephrosis. No gastrointestinal abnormalities or enlarged lymph nodes. Normal appearing appendix. Clear lung bases. Lower thoracic spine degenerative changes.  IMPRESSION: No acute abnormality.   Electronically Signed   By: Gordan PaymentSteve  Reid M.D.   On: 06/30/2013 19:52     EKG Interpretation None      MDM   Final diagnoses:  Urinary tract infection    No acute abdomen. CT scan of abdomen pelvis negative acute. Urinalysis shows too numerous to count white  cells. IV Rocephin 1 g. Discharge medications Cipro 500 mg twice a day, Tramadol, Phenergan 25 mg  I personally performed the services described in this documentation, which was scribed in my presence. The recorded information has been reviewed and is accurate.     Samantha HutchingBrian Yichen Gilardi, MD 06/30/13 2204

## 2013-06-30 NOTE — ED Notes (Signed)
Cold sx starting last night.  Lower abd pain shooting to lower back starting last night.  Reports n/v in past couple of days, but denies today.

## 2013-06-30 NOTE — Discharge Instructions (Signed)
You have a urinary tract infection. Increase fluids. Antibiotic twice a day for one week. Also prescription for pain and nausea medicine

## 2013-07-02 LAB — URINE CULTURE: Colony Count: >=100000

## 2013-07-02 LAB — GC/CHLAMYDIA PROBE AMP
CT PROBE, AMP APTIMA: NEGATIVE
GC Probe RNA: NEGATIVE

## 2016-02-12 ENCOUNTER — Emergency Department (HOSPITAL_COMMUNITY): Payer: Medicaid Other

## 2016-02-12 ENCOUNTER — Emergency Department (HOSPITAL_COMMUNITY)
Admission: EM | Admit: 2016-02-12 | Discharge: 2016-02-12 | Disposition: A | Discharge status: Home / Self Care | Payer: Medicaid Other | Attending: Emergency Medicine | Admitting: Emergency Medicine

## 2016-02-12 ENCOUNTER — Encounter (HOSPITAL_COMMUNITY): Payer: Self-pay | Admitting: Emergency Medicine

## 2016-02-12 DIAGNOSIS — R51 Headache: Secondary | ICD-10-CM | POA: Diagnosis present

## 2016-02-12 DIAGNOSIS — Z79899 Other long term (current) drug therapy: Secondary | ICD-10-CM | POA: Diagnosis not present

## 2016-02-12 DIAGNOSIS — R519 Headache, unspecified: Secondary | ICD-10-CM

## 2016-02-12 DIAGNOSIS — I10 Essential (primary) hypertension: Secondary | ICD-10-CM | POA: Diagnosis not present

## 2016-02-12 DIAGNOSIS — F17210 Nicotine dependence, cigarettes, uncomplicated: Secondary | ICD-10-CM | POA: Diagnosis not present

## 2016-02-12 HISTORY — DX: Sleep apnea, unspecified: G47.30

## 2016-02-12 LAB — BASIC METABOLIC PANEL
ANION GAP: 5 (ref 5–15)
BUN: 13 mg/dL (ref 6–20)
CALCIUM: 8.7 mg/dL — AB (ref 8.9–10.3)
CO2: 26 mmol/L (ref 22–32)
Chloride: 106 mmol/L (ref 101–111)
Creatinine, Ser: 1 mg/dL (ref 0.44–1.00)
GFR calc non Af Amer: >60 mL/min (ref >60)
GLUCOSE: 73 mg/dL (ref 65–99)
Potassium: 3.7 mmol/L (ref 3.5–5.1)
SODIUM: 137 mmol/L (ref 135–145)

## 2016-02-12 LAB — CBC
HCT: 40.7 % (ref 36.0–46.0)
Hemoglobin: 13.1 g/dL (ref 12.0–15.0)
MCH: 25.7 pg — ABNORMAL LOW (ref 26.0–34.0)
MCHC: 32.2 g/dL (ref 30.0–36.0)
MCV: 79.8 fL (ref 78.0–100.0)
PLATELETS: 235 10*3/uL (ref 150–400)
RBC: 5.1 MIL/uL (ref 3.87–5.11)
RDW: 16.3 % — ABNORMAL HIGH (ref 11.5–15.5)
WBC: 6.6 10*3/uL (ref 4.0–10.5)

## 2016-02-12 MED ORDER — DIPHENHYDRAMINE HCL 50 MG/ML IJ SOLN
25.0000 mg | Freq: Once | INTRAMUSCULAR | Status: AC
  Administered 2016-02-12: 25 mg via INTRAVENOUS
  Filled 2016-02-12: qty 1

## 2016-02-12 MED ORDER — LISINOPRIL-HYDROCHLOROTHIAZIDE 20-12.5 MG PO TABS: 1.0000 | ORAL_TABLET | Freq: Every day | ORAL | 1 refills | Status: DC

## 2016-02-12 MED ORDER — KETOROLAC TROMETHAMINE 30 MG/ML IJ SOLN
30.0000 mg | Freq: Once | INTRAMUSCULAR | Status: AC
  Administered 2016-02-12: 30 mg via INTRAVENOUS
  Filled 2016-02-12: qty 1

## 2016-02-12 MED ORDER — SODIUM CHLORIDE 0.9 % IV BOLUS (SEPSIS)
1000.0000 mL | Freq: Once | INTRAVENOUS | Status: AC
  Administered 2016-02-12: 1000 mL via INTRAVENOUS

## 2016-02-12 MED ORDER — CLONIDINE HCL 0.2 MG PO TABS
0.2000 mg | ORAL_TABLET | Freq: Once | ORAL | Status: AC
  Administered 2016-02-12: 0.2 mg via ORAL
  Filled 2016-02-12: qty 1

## 2016-02-12 MED ORDER — METOCLOPRAMIDE HCL 5 MG/ML IJ SOLN
10.0000 mg | Freq: Once | INTRAMUSCULAR | Status: AC
  Administered 2016-02-12: 10 mg via INTRAVENOUS
  Filled 2016-02-12: qty 2

## 2016-02-12 NOTE — ED Triage Notes (Signed)
Pt reports headache that started yesterday. Pt denies hx of migraines but states she does have headaches. Pt reports feeling lightheaded. Pt has hx of HTN, states she has taken her medication today.

## 2016-02-12 NOTE — ED Provider Notes (Signed)
[facility]-EMERGENCY DEPT Provider Note   CSN: [id number] Arrival date & time: 02/12/16  1504     History   Chief Complaint Chief Complaint  Patient presents with  . Headache    HPI Samantha Mccarthy is a 30 y.o. female.Level V caveat for urgent need for intervention  Patient reports frontal headache since yesterday.  She has hypertension and her blood pressure tends to run very high. She apparently takes amlodipine unknown dose and "lisinopril 10 mg 2 tablets" all the morning. No stiff neck, neurological deficits, chest pain, dyspnea. She works second shift as a LawyerCNA. Review systems positive for lightheadedness.      Past Medical History:  Diagnosis Date  . Hypertension   . Sleep apnea     There are no active problems to display for this patient.   History reviewed. No pertinent surgical history.  OB History    Gravida Para Term Preterm AB Living             0   SAB TAB Ectopic Multiple Live Births                   Home Medications    Prior to Admission medications   Medication Sig Start Date End Date Taking? Authorizing Provider  amLODipine (NORVASC) 10 MG tablet Take 10 mg by mouth daily.   Yes Historical Provider, MD  lisinopril-hydrochlorothiazide (ZESTORETIC) 20-12.5 MG tablet Take 1 tablet by mouth daily. 02/12/16   Donnetta HutchingBrian Shanikka Wonders, MD    Family History Family History  Problem Relation Age of Onset  . Cancer Mother   . Heart failure Father   . Diabetes Other     Social History Social History  Substance Use Topics  . Smoking status: Current Every Day Smoker    Packs/day: 0.30    Years: 7.00    Types: Cigarettes  . Smokeless tobacco: Never Used  . Alcohol use Yes     Comment: Occasionally     Allergies   Codeine; Hydrocodone-acetaminophen; Penicillins; and Tylenol [acetaminophen]   Review of Systems Review of Systems  Reason unable to perform ROS: Urgent need for intervention.     Physical Exam Updated Vital Signs BP (!) 140/101   Pulse  72   Temp 97.5 F (36.4 C)   Resp 18   Ht 5' 5.6" (1.666 m)   Wt (!) 357 lb (161.9 kg)   SpO2 96%   BMI 58.33 kg/m   Physical Exam  Constitutional: She is oriented to person, place, and time.  Morbidly obese;  hypertensive  HENT:  Head: Normocephalic and atraumatic.  Eyes: Conjunctivae are normal.  Neck: Neck supple.  Cardiovascular: Normal rate and regular rhythm.   Pulmonary/Chest: Effort normal and breath sounds normal.  Abdominal: Soft. Bowel sounds are normal.  Musculoskeletal: Normal range of motion.  Neurological: She is alert and oriented to person, place, and time.  Skin: Skin is warm and dry.  Psychiatric: She has a normal mood and affect. Her behavior is normal.  Nursing note and vitals reviewed.    ED Treatments / Results  Labs (all labs ordered are listed, but only abnormal results are displayed) Labs Reviewed  CBC - Abnormal; Notable for the following:       Result Value   MCH 25.7 (*)    RDW 16.3 (*)    All other components within normal limits  BASIC METABOLIC PANEL - Abnormal; Notable for the following:    Calcium 8.7 (*)    All other components  within normal limits    EKG  EKG Interpretation None       Radiology Ct Head Wo Contrast  Result Date: 02/12/2016 CLINICAL DATA:  Headache starting yesterday EXAM: CT HEAD WITHOUT CONTRAST TECHNIQUE: Contiguous axial images were obtained from the base of the skull through the vertex without intravenous contrast. COMPARISON:  None. FINDINGS: Brain: No intracranial hemorrhage, mass effect or midline shift. No acute cortical infarction. No hydrocephalus. The gray and white-matter differentiation is preserved. No mass lesion is noted on this unenhanced scan. Vascular: No hyperdense vessel or unexpected calcification. Skull: Normal. Negative for fracture or focal lesion. Sinuses/Orbits: No acute finding. Other: None. IMPRESSION: No acute intracranial abnormality. Electronically Signed   By: Natasha MeadLiviu  Pop M.D.    On: 02/12/2016 16:44    Procedures Procedures (including critical care time)  Medications Ordered in ED Medications  cloNIDine (CATAPRES) tablet 0.2 mg (0.2 mg Oral Given 02/12/16 1541)  sodium chloride 0.9 % bolus 1,000 mL (0 mLs Intravenous Stopped 02/12/16 1705)  ketorolac (TORADOL) 30 MG/ML injection 30 mg (30 mg Intravenous Given 02/12/16 1603)  diphenhydrAMINE (BENADRYL) injection 25 mg (25 mg Intravenous Given 02/12/16 1603)  metoCLOPramide (REGLAN) injection 10 mg (10 mg Intravenous Given 02/12/16 1603)     Initial Impression / Assessment and Plan / ED Course  I have reviewed the triage vital signs and the nursing notes.  Pertinent labs & imaging results that were available during my care of the patient were reviewed by me and considered in my medical decision making (see chart for details).  Clinical Course     Patient is hypertensive. Clonidine 0.2 mg administered. IV fluids, IV Toradol, IV Reglan, IV Benadryl. Patient feels much better. Blood pressure has come down dramatically. Patient states she is on lisinopril plus amlodipine. Will discontinue plain lisinopril and add lisinopril hydrochlorothiazide 20/12.5.  Final Clinical Impressions(s) / ED Diagnoses   Final diagnoses:  Intractable headache, unspecified chronicity pattern, unspecified headache type  Hypertension, unspecified type    New Prescriptions New Prescriptions   LISINOPRIL-HYDROCHLOROTHIAZIDE (ZESTORETIC) 20-12.5 MG TABLET    Take 1 tablet by mouth daily.     Donnetta HutchingBrian Nahiem Dredge, MD 02/12/16 [phone]

## 2016-02-12 NOTE — Discharge Instructions (Signed)
You must get a primary care doctor. Recommend taking your blood pressure twice a week. Change lisinopril to lisinopril//hydrochlorothiazide 20/12.5 once daily

## 2016-02-12 NOTE — ED Notes (Signed)
ED Provider at bedside. 

## 2016-03-08 ENCOUNTER — Emergency Department (HOSPITAL_COMMUNITY)
Admission: EM | Admit: 2016-03-08 | Discharge: 2016-03-08 | Disposition: A | Discharge status: Home / Self Care | Payer: Medicaid Other | Attending: Emergency Medicine | Admitting: Emergency Medicine

## 2016-03-08 ENCOUNTER — Encounter (HOSPITAL_COMMUNITY): Payer: Self-pay

## 2016-03-08 DIAGNOSIS — F17210 Nicotine dependence, cigarettes, uncomplicated: Secondary | ICD-10-CM | POA: Diagnosis not present

## 2016-03-08 DIAGNOSIS — Y9389 Activity, other specified: Secondary | ICD-10-CM | POA: Diagnosis not present

## 2016-03-08 DIAGNOSIS — Z79899 Other long term (current) drug therapy: Secondary | ICD-10-CM | POA: Diagnosis not present

## 2016-03-08 DIAGNOSIS — M79662 Pain in left lower leg: Secondary | ICD-10-CM

## 2016-03-08 DIAGNOSIS — S86812A Strain of other muscle(s) and tendon(s) at lower leg level, left leg, initial encounter: Secondary | ICD-10-CM | POA: Diagnosis not present

## 2016-03-08 DIAGNOSIS — S8992XA Unspecified injury of left lower leg, initial encounter: Secondary | ICD-10-CM | POA: Diagnosis present

## 2016-03-08 DIAGNOSIS — Y929 Unspecified place or not applicable: Secondary | ICD-10-CM | POA: Insufficient documentation

## 2016-03-08 DIAGNOSIS — Y990 Civilian activity done for income or pay: Secondary | ICD-10-CM | POA: Insufficient documentation

## 2016-03-08 DIAGNOSIS — X501XXA Overexertion from prolonged static or awkward postures, initial encounter: Secondary | ICD-10-CM | POA: Diagnosis not present

## 2016-03-08 DIAGNOSIS — S86112A Strain of other muscle(s) and tendon(s) of posterior muscle group at lower leg level, left leg, initial encounter: Secondary | ICD-10-CM

## 2016-03-08 DIAGNOSIS — I10 Essential (primary) hypertension: Secondary | ICD-10-CM | POA: Diagnosis not present

## 2016-03-08 MED ORDER — IBUPROFEN 800 MG PO TABS: 800.0000 mg | ORAL_TABLET | Freq: Three times a day (TID) | ORAL | 0 refills | Status: DC

## 2016-03-08 MED ORDER — TRAMADOL HCL 50 MG PO TABS
50.0000 mg | ORAL_TABLET | Freq: Once | ORAL | Status: AC
  Administered 2016-03-08: 50 mg via ORAL
  Filled 2016-03-08: qty 1

## 2016-03-08 MED ORDER — TRAMADOL HCL 50 MG PO TABS: 50.0000 mg | ORAL_TABLET | Freq: Four times a day (QID) | ORAL | 0 refills | Status: DC | PRN

## 2016-03-08 MED ORDER — IBUPROFEN 800 MG PO TABS
800.0000 mg | ORAL_TABLET | Freq: Once | ORAL | Status: AC
  Administered 2016-03-08: 800 mg via ORAL
  Filled 2016-03-08: qty 1

## 2016-03-08 NOTE — ED Triage Notes (Signed)
STates she was at work and pivited to turn around and left leg gave out on her. Complains of left calf pain.

## 2016-03-08 NOTE — Discharge Instructions (Signed)
Apply ice packs on/off.  Use the crutches for weight bearing.  Call Dr. Mort SawyersHArrison's office to arrange a follow-up appt. Do not wear the ace wrap continuously.

## 2016-03-11 NOTE — ED Provider Notes (Signed)
[facility] Provider Note   CSN: [id number] Arrival date & time: 03/08/16  2040     History   Chief Complaint Chief Complaint  Patient presents with  . Leg Pain    HPI Samantha FrancoisLatoya D Mccarthy is a 30 y.o. female.  HPI  Samantha Mccarthy is a 30 y.o. female who presents to the Emergency Department complaining of left calf pain.  She states that she was working at her job and turned while her foot was planted and felt a sharp pain to her left calf.  Now complains of pain to stand or flex her foot.  She denies numbness, swelling, hip or back pain.   Past Medical History:  Diagnosis Date  . Hypertension   . Sleep apnea     There are no active problems to display for this patient.   History reviewed. No pertinent surgical history.  OB History    Gravida Para Term Preterm AB Living             0   SAB TAB Ectopic Multiple Live Births                   Home Medications    Prior to Admission medications   Medication Sig Start Date End Date Taking? Authorizing Provider  amLODipine (NORVASC) 10 MG tablet Take 10 mg by mouth daily.    Historical Provider, MD  ibuprofen (ADVIL,MOTRIN) 800 MG tablet Take 1 tablet (800 mg total) by mouth 3 (three) times daily. 03/08/16   Denman Pichardo, PA-C  lisinopril-hydrochlorothiazide (ZESTORETIC) 20-12.5 MG tablet Take 1 tablet by mouth daily. 02/12/16   Donnetta HutchingBrian Cook, MD  traMADol (ULTRAM) 50 MG tablet Take 1 tablet (50 mg total) by mouth every 6 (six) hours as needed. 03/08/16   Jlynn Ly, PA-C    Family History Family History  Problem Relation Age of Onset  . Cancer Mother   . Heart failure Father   . Diabetes Other     Social History Social History  Substance Use Topics  . Smoking status: Current Every Day Smoker    Packs/day: 0.30    Years: 7.00    Types: Cigarettes  . Smokeless tobacco: Never Used  . Alcohol use Yes     Comment: Occasionally     Allergies   Codeine; Hydrocodone-acetaminophen; Penicillins; and  Tylenol [acetaminophen]   Review of Systems Review of Systems  Constitutional: Negative for chills and fever.  Gastrointestinal: Negative for abdominal pain.  Musculoskeletal: Positive for myalgias (left calf pain). Negative for arthralgias and joint swelling.  Skin: Negative for color change and wound.  Neurological: Negative for weakness and numbness.  All other systems reviewed and are negative.    Physical Exam Updated Vital Signs BP (!) 185/118 (BP Location: Right Arm)   Pulse 80   Temp 97.3 F (36.3 C) (Oral)   Resp 19   Ht 5\' 6"  (1.676 m)   Wt (!) 160.6 kg   LMP  (LMP Unknown)   SpO2 98%   BMI 57.14 kg/m   Physical Exam  Constitutional: She is oriented to person, place, and time. She appears well-developed and well-nourished. No distress.  HENT:  Head: Atraumatic.  Cardiovascular: Normal rate, regular rhythm and intact distal pulses.   Pulmonary/Chest: Effort normal and breath sounds normal.  Musculoskeletal: She exhibits tenderness. She exhibits no deformity.  ttp of the left gastrocnemius muscle.  Positive Thompson test.   No erythema, or tenderness of the knee or ankle.  DP pulse brisk,  distal sensation intact.   Neurological: She is alert and oriented to person, place, and time. She exhibits normal muscle tone. Coordination normal.  Skin: Skin is warm and dry. No rash noted. No erythema.  Nursing note and vitals reviewed.    ED Treatments / Results  Labs (all labs ordered are listed, but only abnormal results are displayed) Labs Reviewed - No data to display  EKG  EKG Interpretation None       Radiology No results found.  Procedures Procedures (including critical care time)  Medications Ordered in ED Medications  ibuprofen (ADVIL,MOTRIN) tablet 800 mg (800 mg Oral Given 03/08/16 2234)  traMADol (ULTRAM) tablet 50 mg (50 mg Oral Given 03/08/16 2233)     Initial Impression / Assessment and Plan / ED Course  I have reviewed the triage vital  signs and the nursing notes.  Pertinent labs & imaging results that were available during my care of the patient were reviewed by me and considered in my medical decision making (see chart for details).  Clinical Course     Pt with likely gastrocnemius tear.  NV intact.   Pt is obese.  We do not have bariatric crutches with adequate wt support.  rx written.  Ace wrap applied to calf for support, rx for pain medication.  Pt agrees to close ortho f/u.  Appears stable for d/c  Final Clinical Impressions(s) / ED Diagnoses   Final diagnoses:  Pain of left calf  Gastrocnemius muscle tear, left, initial encounter    New Prescriptions Discharge Medication List as of 03/08/2016 10:55 PM    START taking these medications   Details  ibuprofen (ADVIL,MOTRIN) 800 MG tablet Take 1 tablet (800 mg total) by mouth 3 (three) times daily., Starting Tue 03/08/2016, Print    traMADol (ULTRAM) 50 MG tablet Take 1 tablet (50 mg total) by mouth every 6 (six) hours as needed., Starting Tue 03/08/2016, Print         Kamsiyochukwu Buist Shorewoodriplett, PA-C 03/12/16 0013    Loren Raceravid Yelverton, MD 03/12/16 1924

## 2016-07-26 ENCOUNTER — Emergency Department (HOSPITAL_COMMUNITY)
Admission: EM | Admit: 2016-07-26 | Discharge: 2016-07-26 | Disposition: A | Discharge status: Home / Self Care | Payer: Medicaid Other | Attending: Emergency Medicine | Admitting: Emergency Medicine

## 2016-07-26 ENCOUNTER — Emergency Department (HOSPITAL_COMMUNITY): Payer: Medicaid Other

## 2016-07-26 ENCOUNTER — Encounter (HOSPITAL_COMMUNITY): Payer: Self-pay

## 2016-07-26 DIAGNOSIS — R51 Headache: Secondary | ICD-10-CM | POA: Diagnosis present

## 2016-07-26 DIAGNOSIS — G44209 Tension-type headache, unspecified, not intractable: Secondary | ICD-10-CM | POA: Insufficient documentation

## 2016-07-26 DIAGNOSIS — R072 Precordial pain: Secondary | ICD-10-CM | POA: Insufficient documentation

## 2016-07-26 DIAGNOSIS — F17210 Nicotine dependence, cigarettes, uncomplicated: Secondary | ICD-10-CM | POA: Diagnosis not present

## 2016-07-26 DIAGNOSIS — I10 Essential (primary) hypertension: Secondary | ICD-10-CM | POA: Insufficient documentation

## 2016-07-26 DIAGNOSIS — Z79899 Other long term (current) drug therapy: Secondary | ICD-10-CM | POA: Insufficient documentation

## 2016-07-26 LAB — URINALYSIS, ROUTINE W REFLEX MICROSCOPIC
Bilirubin Urine: NEGATIVE
Glucose, UA: NEGATIVE mg/dL
Hgb urine dipstick: NEGATIVE
KETONES UR: NEGATIVE mg/dL
LEUKOCYTES UA: NEGATIVE
NITRITE: NEGATIVE
PH: 6 (ref 5.0–8.0)
PROTEIN: NEGATIVE mg/dL
Specific Gravity, Urine: 1.01 (ref 1.005–1.030)

## 2016-07-26 LAB — COMPREHENSIVE METABOLIC PANEL
ALK PHOS: 62 U/L (ref 38–126)
ALT: 20 U/L (ref 14–54)
ANION GAP: 7 (ref 5–15)
AST: 17 U/L (ref 15–41)
Albumin: 3.5 g/dL (ref 3.5–5.0)
BILIRUBIN TOTAL: 0.4 mg/dL (ref 0.3–1.2)
BUN: 17 mg/dL (ref 6–20)
CO2: 28 mmol/L (ref 22–32)
Calcium: 9.1 mg/dL (ref 8.9–10.3)
Chloride: 103 mmol/L (ref 101–111)
Creatinine, Ser: 0.95 mg/dL (ref 0.44–1.00)
GFR calc Af Amer: >60 mL/min (ref >60)
GFR calc non Af Amer: >60 mL/min (ref >60)
GLUCOSE: 94 mg/dL (ref 65–99)
POTASSIUM: 3.9 mmol/L (ref 3.5–5.1)
Sodium: 138 mmol/L (ref 135–145)
TOTAL PROTEIN: 7.4 g/dL (ref 6.5–8.1)

## 2016-07-26 LAB — CBC WITH DIFFERENTIAL/PLATELET
Basophils Absolute: 0 10*3/uL (ref 0.0–0.1)
Basophils Relative: 0 %
EOS ABS: 0.3 10*3/uL (ref 0.0–0.7)
Eosinophils Relative: 4 %
HEMATOCRIT: 41.2 % (ref 36.0–46.0)
HEMOGLOBIN: 13 g/dL (ref 12.0–15.0)
LYMPHS ABS: 2.3 10*3/uL (ref 0.7–4.0)
LYMPHS PCT: 39 %
MCH: 24.7 pg — AB (ref 26.0–34.0)
MCHC: 31.6 g/dL (ref 30.0–36.0)
MCV: 78.2 fL (ref 78.0–100.0)
MONOS PCT: 9 %
Monocytes Absolute: 0.5 10*3/uL (ref 0.1–1.0)
NEUTROS ABS: 2.8 10*3/uL (ref 1.7–7.7)
NEUTROS PCT: 48 %
Platelets: 214 10*3/uL (ref 150–400)
RBC: 5.27 MIL/uL — AB (ref 3.87–5.11)
RDW: 16.5 % — ABNORMAL HIGH (ref 11.5–15.5)
WBC: 5.9 10*3/uL (ref 4.0–10.5)

## 2016-07-26 LAB — I-STAT BETA HCG BLOOD, ED (MC, WL, AP ONLY): I-stat hCG, quantitative: <5 m[IU]/mL (ref <5)

## 2016-07-26 LAB — I-STAT TROPONIN, ED: TROPONIN I, POC: 0 ng/mL (ref 0.00–0.08)

## 2016-07-26 LAB — LIPASE, BLOOD: Lipase: 26 U/L (ref 11–51)

## 2016-07-26 MED ORDER — LISINOPRIL 20 MG PO TABS: 20.0000 mg | ORAL_TABLET | Freq: Every day | ORAL | 0 refills | Status: DC

## 2016-07-26 MED ORDER — SODIUM CHLORIDE 0.9 % IV BOLUS (SEPSIS)
500.0000 mL | Freq: Once | INTRAVENOUS | Status: AC
  Administered 2016-07-26: 500 mL via INTRAVENOUS

## 2016-07-26 MED ORDER — METOCLOPRAMIDE HCL 5 MG/ML IJ SOLN
10.0000 mg | Freq: Once | INTRAMUSCULAR | Status: AC
  Administered 2016-07-26: 10 mg via INTRAVENOUS
  Filled 2016-07-26: qty 2

## 2016-07-26 MED ORDER — LISINOPRIL 10 MG PO TABS
20.0000 mg | ORAL_TABLET | Freq: Once | ORAL | Status: AC
  Administered 2016-07-26: 20 mg via ORAL
  Filled 2016-07-26: qty 2

## 2016-07-26 MED ORDER — HYDROCHLOROTHIAZIDE 12.5 MG PO TABS: 12.5000 mg | ORAL_TABLET | Freq: Every day | ORAL | 0 refills | Status: DC

## 2016-07-26 MED ORDER — IBUPROFEN 800 MG PO TABS: 800.0000 mg | ORAL_TABLET | Freq: Three times a day (TID) | ORAL | 0 refills | Status: DC | PRN

## 2016-07-26 MED ORDER — DIPHENHYDRAMINE HCL 50 MG/ML IJ SOLN
50.0000 mg | Freq: Once | INTRAMUSCULAR | Status: AC
Start: 2016-07-26 — End: 2016-07-26
  Administered 2016-07-26: 50 mg via INTRAVENOUS
  Filled 2016-07-26: qty 1

## 2016-07-26 MED ORDER — KETOROLAC TROMETHAMINE 30 MG/ML IJ SOLN
30.0000 mg | Freq: Once | INTRAMUSCULAR | Status: AC
  Administered 2016-07-26: 30 mg via INTRAVENOUS
  Filled 2016-07-26: qty 1

## 2016-07-26 MED ORDER — HYDROCHLOROTHIAZIDE 12.5 MG PO CAPS
12.5000 mg | ORAL_CAPSULE | Freq: Once | ORAL | Status: AC
  Administered 2016-07-26: 12.5 mg via ORAL
  Filled 2016-07-26: qty 1

## 2016-07-26 NOTE — ED Notes (Signed)
Pt made aware to return if symptoms worsen or if any life threatening symptoms occur.  Dr. Jacqulyn Bath informed that pt is unable to get a ride home post-benedryl administration.  Dr. Jacqulyn Bath informed nurse to release pt in 30 minutes if pt is not drowsy.  Pt states it takes less than 5 minutes to get home and is not sleepy at this time. Pt is awake, alert and oriented eating graham crackers.

## 2016-07-26 NOTE — ED Notes (Signed)
EKG given to Dr. Long 

## 2016-07-26 NOTE — ED Notes (Signed)
Dr Long at bedside

## 2016-07-26 NOTE — ED Triage Notes (Signed)
Pt reports she forgot her bp medication the day before yesterday.  Says she takes hctz and lisinopril.  C/O headache and chest pain yesterday so she took her bp meds but says didn't help her symptoms.

## 2016-07-26 NOTE — Discharge Instructions (Signed)

## 2016-07-26 NOTE — ED Provider Notes (Signed)
Emergency Department Provider Note   I have reviewed the triage vital signs and the nursing notes.   HISTORY  Chief Complaint Chest Pain and Headache   HPI Samantha Mccarthy is a 31 y.o. female with PMH of HTN presents to the emergency department for evaluation of gradually worsening headache and chest discomfort over the past 24 hours. Patient states she awoke yesterday morning with mild frontal headache and some chest discomfort. She describes her chest discomfort as a "fullness" or like "something is stuck." No fever, chills, productive cough. She is eating and drinking without difficulty. Patient states that her headache has gradually worsened over the past 24 hours. She describes as bifrontal and nonradiating. She has some associated blurry vision. History she thought her symptoms may have been due to her blood pressure being elevated and so she took her home blood pressure medications but these did not help. She also took Advil with no relief. She did not take her blood pressure medications this morning. She was able to sleep throughout the night without difficulty. This morning her headache remained and seemed more severe chest pain was there as well. No associated dyspnea. No pleuritic or exertional pain.   Past Medical History:  Diagnosis Date  . Hypertension   . Sleep apnea     There are no active problems to display for this patient.   History reviewed. No pertinent surgical history.  Current Outpatient Rx  . Order #: [id number] Class: Historical Med  . Order #: [id number] Class: Print  . Order #: [id number] Class: Print  . Order #: [id number] Class: Print  . Order #: [id number] Class: Print  . Order #: [id number] Class: Print    Allergies Codeine; Hydrocodone-acetaminophen; Penicillins; and Tylenol [acetaminophen]  Family History  Problem Relation Age of Onset  . Cancer Mother   . Heart failure Father   . Diabetes Other     Social History Social History  Substance  Use Topics  . Smoking status: Current Every Day Smoker    Packs/day: 0.30    Years: 7.00    Types: Cigarettes  . Smokeless tobacco: Never Used  . Alcohol use Yes     Comment: Occasionally    Review of Systems  Constitutional: No fever/chills Eyes: No visual changes. ENT: No sore throat. Cardiovascular: Positive chest pain. Respiratory: Denies shortness of breath. Gastrointestinal: No abdominal pain.  No nausea, no vomiting.  No diarrhea.  No constipation. Genitourinary: Negative for dysuria. Musculoskeletal: Negative for back pain. Skin: Negative for rash. Neurological: Negative for focal weakness or numbness. Positive HA.   10-point ROS otherwise negative.  ____________________________________________   PHYSICAL EXAM:  VITAL SIGNS: ED Triage Vitals  Enc Vitals Group     BP 07/26/16 0734 (!) 205/120     Pulse Rate 07/26/16 0734 70     Resp 07/26/16 0734 18     Temp 07/26/16 0734 98.3 F (36.8 C)     Temp Source 07/26/16 0734 Oral     SpO2 07/26/16 0734 100 %     Weight 07/26/16 0733 (!) 350 lb (158.8 kg)     Height 07/26/16 0733  (1.651 m)     Pain Score 07/26/16 0729 9   Constitutional: Alert and oriented. Well appearing and in no acute distress. Eyes: Conjunctivae are normal.  Head: Atraumatic. Nose: No congestion/rhinnorhea. Mouth/Throat: Mucous membranes are moist. Oropharynx non-erythematous. Neck: No stridor.  No meningeal signs.  Cardiovascular: Normal rate, regular rhythm. Good peripheral circulation. Grossly normal heart sounds.   Respiratory: Normal respiratory effort.  No retractions. Lungs CTAB. Gastrointestinal: Soft and nontender. No distention.  Musculoskeletal: No lower extremity tenderness nor edema. No gross deformities of extremities. Neurologic:  Normal speech and language. No gross focal neurologic deficits are appreciated.  Skin:  Skin is warm, dry and intact. No rash noted.  ____________________________________________    LABS (all labs ordered are listed, but only abnormal results are displayed)  Labs Reviewed  CBC WITH DIFFERENTIAL/PLATELET - Abnormal; Notable for the following:       Result Value   RBC 5.27 (*)    MCH 24.7 (*)    RDW 16.5 (*)    All other components within normal limits  URINALYSIS, ROUTINE W REFLEX MICROSCOPIC - Abnormal; Notable for the following:    Color, Urine STRAW (*)    All other components within normal limits  COMPREHENSIVE METABOLIC PANEL  LIPASE, BLOOD  I-STAT TROPOININ, ED  I-STAT BETA HCG BLOOD, ED ([facility], [facility], [facility] ONLY)   ____________________________________________  EKG   EKG Interpretation  Date/Time:  Tuesday July 26 2016 07:35:20 EDT Ventricular Rate:  64 PR Interval:    QRS Duration: 101 QT Interval:  447 QTC Calculation: 462 R Axis:   -23 Text Interpretation:  Sinus rhythm Probable left atrial enlargement Left ventricular hypertrophy No STEMI. No old for comparison Confirmed by Lacrisha Bielicki MD, Nai Dasch [phone]) on 07/26/2016 7:46:58 AM       ____________________________________________  RADIOLOGY  Dg Chest 2 View  Result Date: 07/26/2016 CLINICAL DATA:  Chest pain beginning yesterday.  Hypertension. EXAM: CHEST  2 VIEW COMPARISON:  04/27/2010 FINDINGS: The heart size and mediastinal contours are within normal limits. Both lungs are clear. The visualized skeletal structures are unremarkable. IMPRESSION: No active cardiopulmonary disease. Electronically Signed   By: Myles Rosenthal M.D.   On: 07/26/2016 08:26    ____________________________________________   PROCEDURES  Procedure(s) performed:   Procedures  None ____________________________________________   INITIAL IMPRESSION / ASSESSMENT AND PLAN / ED COURSE  Pertinent labs & imaging results that were available during my care of the patient were reviewed by me and considered in my medical decision making (see chart for details).  Patient resents to the emergency department for evaluation of chest  pain and headache. She has significantly elevated blood pressure but did not take her blood pressure medication this morning. Suspect this is secondary to medication noncompliance with associated pain and discomfort which is also elevating her blood pressure. She has no focal neurological deficits on my exam. She is ambulatory without difficulty. Her chest pain is constant, nonexertional, nonpleuritic. Plan for troponin, EKG, chest x-ray, lab work including urinalysis. We'll also treat her blood pressure with her home medications and headache cocktail. No clear nuchal signs or symptoms to suggest subarachnoid hemorrhage or venous sinus thrombosis. No clear indication for head imaging at this time.   CP and HA improved after medciation. BP normalized with pain control and home BP medication. No evidence of HTN emergency. Plan for PCP follow up. Provided refills for home BP meds.   At this time, I do not feel there is any life-threatening condition present. I have reviewed and discussed all results (EKG, imaging, lab, urine as appropriate), exam findings with patient. I have reviewed nursing notes and appropriate previous records.  I feel the patient is safe to be discharged home without further emergent workup. Discussed usual and customary return precautions. Patient and family (if present) verbalize understanding and are comfortable with this plan.  Patient will follow-up with their primary care provider. If they do  not have a primary care provider, information for follow-up has been provided to them. All questions have been answered.  ____________________________________________  FINAL CLINICAL IMPRESSION(S) / ED DIAGNOSES  Final diagnoses:  Precordial chest pain  Acute non intractable tension-type headache  Hypertension, unspecified type     MEDICATIONS GIVEN DURING THIS VISIT:  Medications  sodium chloride 0.9 % bolus 500 mL (0 mLs Intravenous Stopped 07/26/16 0932)  ketorolac (TORADOL) 30  MG/ML injection 30 mg (30 mg Intravenous Given 07/26/16 0759)  metoCLOPramide (REGLAN) injection 10 mg (10 mg Intravenous Given 07/26/16 0803)  diphenhydrAMINE (BENADRYL) injection 50 mg (50 mg Intravenous Given 07/26/16 0801)  lisinopril (PRINIVIL,ZESTRIL) tablet 20 mg (20 mg Oral Given 07/26/16 0759)  hydrochlorothiazide (MICROZIDE) capsule 12.5 mg (12.5 mg Oral Given 07/26/16 0851)     NEW OUTPATIENT MEDICATIONS STARTED DURING THIS VISIT:  Discharge Medication List as of 07/26/2016  9:37 AM    START taking these medications   Details  hydrochlorothiazide (HYDRODIURIL) 12.5 MG tablet Take 1 tablet (12.5 mg total) by mouth daily., Starting Tue 07/26/2016, Until Thu 08/25/2016, Print    lisinopril (PRINIVIL,ZESTRIL) 20 MG tablet Take 1 tablet (20 mg total) by mouth daily., Starting Tue 07/26/2016, Until Thu 08/25/2016, Print        Note:  This document was prepared using Dragon voice recognition software and may include unintentional dictation errors.  Alona Bene, MD Emergency Medicine   Maia Plan, MD 07/26/16 [id number]

## 2016-07-26 NOTE — ED Notes (Signed)
Dr. Jacqulyn Bath notified of episodes of bradycardia (42-45bpm).

## 2016-07-26 NOTE — ED Notes (Signed)
Pt alert and oriented, denies drowsiness.  Pt leaving at this time. Work note given.

## 2018-05-16 ENCOUNTER — Ambulatory Visit (HOSPITAL_COMMUNITY)
Admission: EM | Admit: 2018-05-16 | Discharge: 2018-05-16 | Disposition: A | Discharge status: Home / Self Care | Payer: Self-pay | Attending: Internal Medicine | Admitting: Internal Medicine

## 2018-05-16 ENCOUNTER — Encounter (HOSPITAL_COMMUNITY): Payer: Self-pay | Admitting: Emergency Medicine

## 2018-05-16 DIAGNOSIS — Z202 Contact with and (suspected) exposure to infections with a predominantly sexual mode of transmission: Secondary | ICD-10-CM | POA: Insufficient documentation

## 2018-05-16 DIAGNOSIS — I160 Hypertensive urgency: Secondary | ICD-10-CM | POA: Insufficient documentation

## 2018-05-16 DIAGNOSIS — Z9114 Patient's other noncompliance with medication regimen: Secondary | ICD-10-CM

## 2018-05-16 DIAGNOSIS — I10 Essential (primary) hypertension: Secondary | ICD-10-CM

## 2018-05-16 MED ORDER — HYDROCHLOROTHIAZIDE 12.5 MG PO TABS: 12.5000 mg | ORAL_TABLET | Freq: Every day | ORAL | 0 refills | Status: DC

## 2018-05-16 MED ORDER — CLONIDINE HCL 0.1 MG PO TABS
ORAL_TABLET | ORAL | Status: AC
  Filled 2018-05-16: qty 1

## 2018-05-16 MED ORDER — LISINOPRIL 20 MG PO TABS: 20.0000 mg | ORAL_TABLET | Freq: Every day | ORAL | 0 refills | Status: DC

## 2018-05-16 MED ORDER — CLONIDINE HCL 0.1 MG PO TABS
0.2000 mg | ORAL_TABLET | Freq: Once | ORAL | Status: AC
  Administered 2018-05-16: 0.2 mg via ORAL

## 2018-05-16 MED ORDER — AMLODIPINE BESYLATE 10 MG PO TABS: 10.0000 mg | ORAL_TABLET | Freq: Every day | ORAL | 2 refills | Status: DC

## 2018-05-16 NOTE — ED Triage Notes (Signed)
Pt has not been taking her bp meds, states she is out. Denies headache or neuro symptoms.

## 2018-05-16 NOTE — ED Triage Notes (Signed)
Pt states her partner made some sort of comment that he "had soemthing" and wanted to be checked for stds. Pt denies symptoms.

## 2018-05-16 NOTE — ED Provider Notes (Signed)
[facility]    CSN: [id number] Arrival date & time: 05/16/18  1636     History   Chief Complaint Chief Complaint  Patient presents with  . Exposure to STD    HPI Samantha Mccarthy is a 33 y.o. female with a history of hypertension, obstructive sleep apnea comes to the urgent care department on account of STD exposure.  Patient has no complaints at this time.  She denies any headache, dizziness, nausea vomiting, chest pain or chest pressure.  Her blood pressure is 225/130.  She is on amlodipine, lisinopril and hydrochlorothiazide but has run out of her medications recently.  She did not seem bothered about the blood pressure being very elevated.   HPI  Past Medical History:  Diagnosis Date  . Hypertension   . Sleep apnea     There are no active problems to display for this patient.   History reviewed. No pertinent surgical history.  OB History    Gravida      Para      Term      Preterm      AB      Living  0     SAB      TAB      Ectopic      Multiple      Live Births               Home Medications    Prior to Admission medications   Medication Sig Start Date End Date Taking? Authorizing Provider  amLODipine (NORVASC) 10 MG tablet Take 10 mg by mouth daily.    [provider]  hydrochlorothiazide (HYDRODIURIL) 12.5 MG tablet Take 1 tablet (12.5 mg total) by mouth daily. 07/26/16 08/25/16  Long, Arlyss RepressJoshua G, MD  ibuprofen (ADVIL,MOTRIN) 800 MG tablet Take 1 tablet (800 mg total) by mouth every 8 (eight) hours as needed. 07/26/16   Long, Arlyss RepressJoshua G, MD  lisinopril (PRINIVIL,ZESTRIL) 20 MG tablet Take 1 tablet (20 mg total) by mouth daily. 07/26/16 08/25/16  Maia PlanLong, Joshua G, MD  lisinopril-hydrochlorothiazide (ZESTORETIC) 20-12.5 MG tablet Take 1 tablet by mouth daily. 02/12/16   Donnetta Hutchingook, Brian, MD  traMADol (ULTRAM) 50 MG tablet Take 1 tablet (50 mg total) by mouth every 6 (six) hours as needed. 03/08/16   Pauline Ausriplett, Tammy, PA-C    Family  History Family History  Problem Relation Age of Onset  . Cancer Mother   . Heart failure Father   . Diabetes Other     Social History Social History   Tobacco Use  . Smoking status: Current Every Day Smoker    Packs/day: 0.30    Years: 7.00    Pack years: 2.10    Types: Cigarettes  . Smokeless tobacco: Never Used  Substance Use Topics  . Alcohol use: Yes    Comment: Occasionally  . Drug use: No     Allergies   Codeine; Hydrocodone-acetaminophen; Penicillins; and Tylenol [acetaminophen]   Review of Systems Review of Systems  Constitutional: Negative for activity change and appetite change.  HENT: Negative.   Eyes: Negative.   Respiratory: Negative for cough, chest tightness, shortness of breath and wheezing.   Cardiovascular: Negative.  Negative for chest pain and palpitations.  Gastrointestinal: Negative.   Genitourinary: Negative for dysuria, frequency, genital sores, pelvic pain, urgency, vaginal discharge and vaginal pain.  Musculoskeletal: Negative.  Negative for arthralgias and back pain.  Neurological: Negative for dizziness, weakness, numbness and headaches.  Hematological: Negative for adenopathy.  Physical Exam Triage Vital Signs ED Triage Vitals [05/16/18 1705]  Enc Vitals Group     BP (!) 221/124     Pulse Rate 91     Resp 18     Temp 97.7 F (36.5 C)     Temp src      SpO2 99 %     Weight      Height      Head Circumference      Peak Flow      Pain Score 0     Pain Loc      Pain Edu?      Excl. in GC?    No data found.  Updated Vital Signs BP (!) 221/124   Pulse 91   Temp 97.7 F (36.5 C)   Resp 18   SpO2 99%   Visual Acuity Right Eye Distance:   Left Eye Distance:   Bilateral Distance:    Right Eye Near:   Left Eye Near:    Bilateral Near:     Physical Exam Constitutional:      Appearance: Normal appearance. She is obese. She is not ill-appearing.  HENT:     Right Ear: Tympanic membrane normal.     Left Ear:  Tympanic membrane normal.     Mouth/Throat:     Mouth: Mucous membranes are moist.     Pharynx: No posterior oropharyngeal erythema.  Eyes:     Extraocular Movements: Extraocular movements intact.     Conjunctiva/sclera: Conjunctivae normal.     Pupils: Pupils are equal, round, and reactive to light.  Neck:     Musculoskeletal: Normal range of motion. No neck rigidity.  Cardiovascular:     Rate and Rhythm: Normal rate and regular rhythm.     Pulses: Normal pulses.  Pulmonary:     Effort: Pulmonary effort is normal.  Abdominal:     General: Abdomen is flat. Bowel sounds are normal. There is no distension.     Palpations: Abdomen is soft.     Tenderness: There is no abdominal tenderness.  Musculoskeletal: Normal range of motion.        General: No swelling.  Lymphadenopathy:     Cervical: No cervical adenopathy.  Skin:    General: Skin is warm.     Capillary Refill: Capillary refill takes less than 2 seconds.     Coloration: Skin is not jaundiced.     Findings: No bruising.  Neurological:     General: No focal deficit present.     Mental Status: She is alert.     Cranial Nerves: No cranial nerve deficit.     Motor: No weakness.     Gait: Gait normal.  Psychiatric:        Mood and Affect: Mood normal.        Behavior: Behavior normal.      UC Treatments / Results  Labs (all labs ordered are listed, but only abnormal results are displayed) Labs Reviewed  CERVICOVAGINAL ANCILLARY ONLY    EKG None  Radiology No results found.  Procedures Procedures (including critical care time)  Medications Ordered in UC Medications - No data to display  Initial Impression / Assessment and Plan / UC Course  I have reviewed the triage vital signs and the nursing notes.  Pertinent labs & imaging results that were available during my care of the patient were reviewed by me and considered in my medical decision making (see chart for details).     1.  Hypertensive  urgency: Clonidine 0.2 mg stat Patient compliance with medication was emphasized Amlodipine, hydrochlorothiazide and lisinopril refill sent to the pharmacy Patient left before repeat blood pressure could be checked. Patient was eager to leave.  2.  Concern for STD exposure: Cervicovaginal swab for GC chlamydia  3.  Morbid obesity: Weight loss advised.  Final Clinical Impressions(s) / UC Diagnoses   Final diagnoses:  None   Discharge Instructions   None    ED Prescriptions    None     Controlled Substance Prescriptions [place] Controlled Substance Registry consulted? No   Merrilee Jansky, MD 05/16/18 1756

## 2018-05-17 ENCOUNTER — Telehealth (HOSPITAL_COMMUNITY): Payer: Self-pay | Admitting: Emergency Medicine

## 2018-05-17 LAB — CERVICOVAGINAL ANCILLARY ONLY
CHLAMYDIA, DNA PROBE: NEGATIVE
NEISSERIA GONORRHEA: NEGATIVE
TRICH (WINDOWPATH): POSITIVE — AB

## 2018-05-17 MED ORDER — METRONIDAZOLE 500 MG PO TABS: 2000.0000 mg | ORAL_TABLET | Freq: Once | ORAL | 0 refills | Status: AC

## 2018-05-17 NOTE — Telephone Encounter (Signed)
Trichomonas is positive. Rx  for Flagyl 2 grams, once was sent to the pharmacy of record. Pt needs education to refrain from sexual intercourse for 7 days to give the medicine time to work. Sexual partners need to be notified and tested/treated. Condoms may reduce risk of reinfection. Recheck for further evaluation if symptoms are not improving.   Patient contacted and made aware of all results, all questions answered.   

## 2018-05-25 ENCOUNTER — Telehealth (HOSPITAL_COMMUNITY): Payer: Self-pay | Admitting: Internal Medicine

## 2018-05-25 NOTE — Telephone Encounter (Signed)
Attempted to call patient today 05/25/2018. Phone number not in service.

## 2018-08-13 ENCOUNTER — Emergency Department (HOSPITAL_COMMUNITY): Payer: Self-pay

## 2018-08-13 ENCOUNTER — Emergency Department (HOSPITAL_COMMUNITY)
Admission: EM | Admit: 2018-08-13 | Discharge: 2018-08-13 | Disposition: A | Discharge status: Home / Self Care | Payer: Self-pay | Attending: Emergency Medicine | Admitting: Emergency Medicine

## 2018-08-13 ENCOUNTER — Other Ambulatory Visit: Payer: Self-pay

## 2018-08-13 DIAGNOSIS — I159 Secondary hypertension, unspecified: Secondary | ICD-10-CM

## 2018-08-13 DIAGNOSIS — I10 Essential (primary) hypertension: Secondary | ICD-10-CM | POA: Insufficient documentation

## 2018-08-13 DIAGNOSIS — N179 Acute kidney failure, unspecified: Secondary | ICD-10-CM | POA: Insufficient documentation

## 2018-08-13 DIAGNOSIS — Z79899 Other long term (current) drug therapy: Secondary | ICD-10-CM | POA: Insufficient documentation

## 2018-08-13 DIAGNOSIS — F17210 Nicotine dependence, cigarettes, uncomplicated: Secondary | ICD-10-CM | POA: Insufficient documentation

## 2018-08-13 DIAGNOSIS — G43819 Other migraine, intractable, without status migrainosus: Secondary | ICD-10-CM | POA: Insufficient documentation

## 2018-08-13 LAB — CBC
HCT: 44.2 % (ref 36.0–46.0)
Hemoglobin: 13.3 g/dL (ref 12.0–15.0)
MCH: 24 pg — ABNORMAL LOW (ref 26.0–34.0)
MCHC: 30.1 g/dL (ref 30.0–36.0)
MCV: 79.6 fL — ABNORMAL LOW (ref 80.0–100.0)
Platelets: 230 10*3/uL (ref 150–400)
RBC: 5.55 MIL/uL — ABNORMAL HIGH (ref 3.87–5.11)
RDW: 16.1 % — ABNORMAL HIGH (ref 11.5–15.5)
WBC: 7.4 10*3/uL (ref 4.0–10.5)
nRBC: 0 % (ref 0.0–0.2)

## 2018-08-13 LAB — COMPREHENSIVE METABOLIC PANEL
ALT: 13 U/L (ref 0–44)
AST: 10 U/L — ABNORMAL LOW (ref 15–41)
Albumin: 3.1 g/dL — ABNORMAL LOW (ref 3.5–5.0)
Alkaline Phosphatase: 63 U/L (ref 38–126)
Anion gap: 8 (ref 5–15)
BUN: 14 mg/dL (ref 6–20)
CO2: 26 mmol/L (ref 22–32)
Calcium: 8.9 mg/dL (ref 8.9–10.3)
Chloride: 105 mmol/L (ref 98–111)
Creatinine, Ser: 1.58 mg/dL — ABNORMAL HIGH (ref 0.44–1.00)
GFR calc Af Amer: 49 mL/min — ABNORMAL LOW (ref >60)
GFR calc non Af Amer: 43 mL/min — ABNORMAL LOW (ref >60)
Glucose, Bld: 102 mg/dL — ABNORMAL HIGH (ref 70–99)
Potassium: 4.1 mmol/L (ref 3.5–5.1)
Sodium: 139 mmol/L (ref 135–145)
Total Bilirubin: 0.4 mg/dL (ref 0.3–1.2)
Total Protein: 7 g/dL (ref 6.5–8.1)

## 2018-08-13 MED ORDER — PROCHLORPERAZINE EDISYLATE 10 MG/2ML IJ SOLN
10.0000 mg | Freq: Once | INTRAMUSCULAR | Status: AC
Start: 2018-08-13 — End: 2018-08-13
  Administered 2018-08-13: 10 mg via INTRAVENOUS
  Filled 2018-08-13: qty 2

## 2018-08-13 MED ORDER — AMLODIPINE BESYLATE 5 MG PO TABS
10.0000 mg | ORAL_TABLET | Freq: Every day | ORAL | Status: DC
  Administered 2018-08-13: 10 mg via ORAL
  Filled 2018-08-13: qty 2

## 2018-08-13 MED ORDER — LISINOPRIL 20 MG PO TABS: 20.0000 mg | ORAL_TABLET | Freq: Every day | ORAL | 0 refills | Status: DC

## 2018-08-13 MED ORDER — HYDROCHLOROTHIAZIDE 25 MG PO TABS
12.5000 mg | ORAL_TABLET | Freq: Every day | ORAL | Status: DC
  Administered 2018-08-13: 22:00:00 12.5 mg via ORAL
  Filled 2018-08-13: qty 1

## 2018-08-13 MED ORDER — DIPHENHYDRAMINE HCL 25 MG PO CAPS
25.0000 mg | ORAL_CAPSULE | Freq: Once | ORAL | Status: AC
  Administered 2018-08-13: 25 mg via ORAL
  Filled 2018-08-13: qty 1

## 2018-08-13 MED ORDER — AMLODIPINE BESYLATE 10 MG PO TABS: 10.0000 mg | ORAL_TABLET | Freq: Every day | ORAL | 0 refills | Status: DC

## 2018-08-13 MED ORDER — HYDROCHLOROTHIAZIDE 12.5 MG PO TABS: 12.5000 mg | ORAL_TABLET | Freq: Every day | ORAL | 0 refills | Status: DC

## 2018-08-13 NOTE — ED Provider Notes (Signed)
[facility] Provider Note   CSN: [id number] Arrival date & time: 08/13/18  1749    History   Chief Complaint Chief Complaint  Patient presents with   Migraine   Hypertension    HPI Samantha Mccarthy is a 33 y.o. female with a history of uncontrolled HTN (medication non-compliance), migraines who presents with her typical migraine that was not relieved by ibuprofen.  Also endorses that she has not taken her blood pressure medication in over a month because she has been out and has not seen her PCP. Headache started yesterday and then woke her up again at 2am. 7/10 constant. No fevers.     HPI  Past Medical History:  Diagnosis Date   Hypertension    Sleep apnea     There are no active problems to display for this patient.   No past surgical history on file.   OB History    Gravida      Para      Term      Preterm      AB      Living  0     SAB      TAB      Ectopic      Multiple      Live Births               Home Medications    Prior to Admission medications   Medication Sig Start Date End Date Taking? Authorizing Provider  ibuprofen (ADVIL) 200 MG tablet Take 600 mg by mouth every 6 (six) hours as needed for moderate pain.   Yes [provider]  ibuprofen (ADVIL,MOTRIN) 800 MG tablet Take 1 tablet (800 mg total) by mouth every 8 (eight) hours as needed. 07/26/16  Yes Long, Arlyss RepressJoshua G, MD  traMADol (ULTRAM) 50 MG tablet Take 1 tablet (50 mg total) by mouth every 6 (six) hours as needed. 03/08/16  Yes Triplett, Tammy, PA-C  amLODipine (NORVASC) 10 MG tablet Take 1 tablet (10 mg total) by mouth daily. 08/13/18   Dicky DoeFord, Xxavier Noon, MD  hydrochlorothiazide (HYDRODIURIL) 12.5 MG tablet Take 1 tablet (12.5 mg total) by mouth daily for 30 days. 08/13/18 09/12/18  Dicky DoeFord, Damar Petit, MD  lisinopril (ZESTRIL) 20 MG tablet Take 1 tablet (20 mg total) by mouth daily for 30 days. 08/13/18 09/12/18  Dicky DoeFord, Gabriella Woodhead, MD     Family History Family History  Problem Relation Age of Onset   Cancer Mother    Heart failure Father    Diabetes Other     Social History Social History   Tobacco Use   Smoking status: Current Every Day Smoker    Packs/day: 0.30    Years: 7.00    Pack years: 2.10    Types: Cigarettes   Smokeless tobacco: Never Used  Substance Use Topics   Alcohol use: Yes    Comment: Occasionally   Drug use: No     Allergies   Codeine; Hydrocodone-acetaminophen; Penicillins; and Tylenol [acetaminophen]   Review of Systems Review of Systems  Constitutional: Negative for activity change, appetite change, diaphoresis, fatigue and fever.  Respiratory: Negative for chest tightness and shortness of breath.   Cardiovascular: Negative for chest pain.  Gastrointestinal: Negative for abdominal pain, diarrhea, nausea and vomiting.  Genitourinary: Negative for difficulty urinating and dysuria.  Musculoskeletal: Negative for back pain and neck pain.  Skin: Negative for wound.  Neurological: Positive for headaches. Negative for dizziness, facial asymmetry and weakness.  Physical Exam Updated Vital Signs BP (!) 147/118    Pulse 89    Temp 98.4 F (36.9 C) (Oral)    Resp 18    Ht 5\' 6"  (1.676 m)    Wt (!) 163.3 kg    LMP 07/30/2018 (Exact Date)    SpO2 100%    BMI 58.11 kg/m   Physical Exam Vitals signs and nursing note reviewed.  Constitutional:      General: She is not in acute distress.    Appearance: She is well-developed. She is obese. She is not ill-appearing.  HENT:     Head: Normocephalic and atraumatic.  Eyes:     Conjunctiva/sclera: Conjunctivae normal.     Pupils: Pupils are equal, round, and reactive to light.  Neck:     Musculoskeletal: Neck supple. No muscular tenderness.  Cardiovascular:     Rate and Rhythm: Normal rate and regular rhythm.     Heart sounds: No murmur.  Pulmonary:     Effort: Pulmonary effort is normal. No respiratory distress.     Breath  sounds: Normal breath sounds. No wheezing, rhonchi or rales.  Abdominal:     Palpations: Abdomen is soft.     Tenderness: There is no abdominal tenderness.  Musculoskeletal:     Right lower leg: No edema.     Left lower leg: No edema.  Skin:    General: Skin is warm and dry.  Neurological:     General: No focal deficit present.     Mental Status: She is alert and oriented to person, place, and time.     Cranial Nerves: No cranial nerve deficit.     Sensory: No sensory deficit.     Motor: No weakness.     Coordination: Coordination normal.     Gait: Gait normal.  Psychiatric:        Mood and Affect: Mood normal.        Behavior: Behavior normal.      ED Treatments / Results  Labs (all labs ordered are listed, but only abnormal results are displayed) Labs Reviewed  CBC - Abnormal; Notable for the following components:      Result Value   RBC 5.55 (*)    MCV 79.6 (*)    MCH 24.0 (*)    RDW 16.1 (*)    All other components within normal limits  COMPREHENSIVE METABOLIC PANEL - Abnormal; Notable for the following components:   Glucose, Bld 102 (*)    Creatinine, Ser 1.58 (*)    Albumin 3.1 (*)    AST 10 (*)    GFR calc non Af Amer 43 (*)    GFR calc Af Amer 49 (*)    All other components within normal limits    EKG None  Radiology Ct Head Wo Contrast  Result Date: 08/13/2018 CLINICAL DATA:  Migraine headache which began last night. EXAM: CT HEAD WITHOUT CONTRAST TECHNIQUE: Contiguous axial images were obtained from the base of the skull through the vertex without intravenous contrast. COMPARISON:  01/01/2017 FINDINGS: Brain: No evidence of acute infarction, hemorrhage, hydrocephalus, extra-axial collection or mass lesion/mass effect. Vascular: No hyperdense vessel or unexpected calcification. Skull: The calvarium is intact. Sinuses/Orbits: Paranasal sinuses and mastoid air cells are clear. Other: Left upper anterior frontal periapical lucency is identified involving the  left central incisor. IMPRESSION: 1. No acute intracranial abnormalities. 2. Suspect left upper central incisor periapical abscess. Electronically Signed   By: Signa Kell M.D.   On: 08/13/2018 19:00  Procedures Procedures (including critical care time)  Medications Ordered in ED Medications  amLODipine (NORVASC) tablet 10 mg (10 mg Oral Given 08/13/18 2129)  hydrochlorothiazide (HYDRODIURIL) tablet 12.5 mg (12.5 mg Oral Given 08/13/18 2132)  prochlorperazine (COMPAZINE) injection 10 mg (10 mg Intravenous Given 08/13/18 2132)  diphenhydrAMINE (BENADRYL) capsule 25 mg (25 mg Oral Given 08/13/18 2131)     Initial Impression / Assessment and Plan / ED Course  I have reviewed the triage vital signs and the nursing notes.  Pertinent labs & imaging results that were available during my care of the patient were reviewed by me and considered in my medical decision making (see chart for details).        Nida Manfredi Priest is a 33 y.o. female with a history of uncontrolled HTN (medication non-compliance), migraines who presents with her typical migraine that was not relieved by ibuprofen.  Also endorses that she has not taken her blood pressure medication in over a month because she has been out and has not seen her PCP. Headache started yesterday and then woke her up again at 2am. 7/10 constant. No fevers.   Meningitis is unlikely due to no fever, neck stiffness or meningeal signs Encephalitis is unlikely due to no fever or AMS  Carotid artery dissection is unlikely because there is no unilateral neck pain or associated trauma Hypertensive encephalitis is unlikely because there are no neuro symptoms Temporal arteritis/GCA is unlikely because the temporal area is not tender to palpation, and the patient is <54y/o Acute angle glaucoma is unlikely because there is no unilateral blurry vision or a fixed pupil  CT head obtained showed no acute abnormalities.   Patient is hypertensive to the  230s. She has a long history of medication non-compliance. She also have a bump in her Cr 1.58 with a normal BUN.  Patient given IV compazine and PO benadryl with resolution of headache.  Lengthy discussion had with patient on importance of taking BP meds including complications of stroke of CKD. Patient given home dose while in the ED with improvement in BP. I also provided a refill for patient and gave her information for PCP follow up.  Patient at this time asymptomatic and requesting discharge. Patient discharged home in stable condition.  Final Clinical Impressions(s) / ED Diagnoses   Final diagnoses:  Secondary hypertension  Other migraine without status migrainosus, intractable  AKI (acute kidney injury) [facility])    ED Discharge Orders         Ordered    amLODipine (NORVASC) 10 MG tablet  Daily     08/13/18 2223    hydrochlorothiazide (HYDRODIURIL) 12.5 MG tablet  Daily     08/13/18 2223    lisinopril (ZESTRIL) 20 MG tablet  Daily     08/13/18 2223           Dicky Doe, MD 08/14/18 1610    Marily Memos, MD 08/15/18 9604

## 2018-08-13 NOTE — ED Notes (Signed)
All appropriate discharge materials reviewed with patient at length. Time for questions provided. Pt denies any further questions at this time. Verbalizes understanding of all provided materials.  

## 2018-08-13 NOTE — ED Triage Notes (Signed)
Patient reports migraine onset last night, some relief with ibuprofen, but states it woke her up around 0200 this morning. N/V x 1. Hx headaches, but hasn't had one in a while. Also reports HTN - out of meds x 1 month.

## 2018-08-31 ENCOUNTER — Ambulatory Visit (HOSPITAL_COMMUNITY)
Admission: EM | Admit: 2018-08-31 | Discharge: 2018-08-31 | Disposition: A | Discharge status: Home / Self Care | Payer: Self-pay | Attending: Family Medicine | Admitting: Family Medicine

## 2018-08-31 ENCOUNTER — Other Ambulatory Visit: Payer: Self-pay

## 2018-08-31 ENCOUNTER — Encounter (HOSPITAL_COMMUNITY): Payer: Self-pay

## 2018-08-31 ENCOUNTER — Ambulatory Visit (INDEPENDENT_AMBULATORY_CARE_PROVIDER_SITE_OTHER): Payer: Self-pay

## 2018-08-31 DIAGNOSIS — M25462 Effusion, left knee: Secondary | ICD-10-CM

## 2018-08-31 DIAGNOSIS — M1712 Unilateral primary osteoarthritis, left knee: Secondary | ICD-10-CM

## 2018-08-31 MED ORDER — PREDNISONE 10 MG (21) PO TBPK: ORAL_TABLET | ORAL | 0 refills | Status: DC

## 2018-08-31 NOTE — ED Provider Notes (Signed)
[facility]    CSN: [id number] Arrival date & time: 08/31/18  1431     History   Chief Complaint Chief Complaint  Patient presents with  . Knee Pain    HPI Samantha Mccarthy is a 33 y.o. female.   Pt is a 33 year old female that presents with left knee pain. This has been present and worse over the past few days. There is swelling. No injuries. Hx of knee problems in the past. No associated fever, numbness, tingling. She has not taken anything for the pain.  BP very elevated today but she reports that she did not take her BP meds this am.   ROS per HPI      Past Medical History:  Diagnosis Date  . Hypertension   . Sleep apnea     There are no active problems to display for this patient.   History reviewed. No pertinent surgical history.  OB History    Gravida      Para      Term      Preterm      AB      Living  0     SAB      TAB      Ectopic      Multiple      Live Births               Home Medications    Prior to Admission medications   Medication Sig Start Date End Date Taking? Authorizing Provider  amLODipine (NORVASC) 10 MG tablet Take 1 tablet (10 mg total) by mouth daily. 08/13/18   Dicky DoeFord, Kristy, MD  hydrochlorothiazide (HYDRODIURIL) 12.5 MG tablet Take 1 tablet (12.5 mg total) by mouth daily for 30 days. 08/13/18 09/12/18  Dicky DoeFord, Kristy, MD  ibuprofen (ADVIL) 200 MG tablet Take 600 mg by mouth every 6 (six) hours as needed for moderate pain.    [provider]  ibuprofen (ADVIL,MOTRIN) 800 MG tablet Take 1 tablet (800 mg total) by mouth every 8 (eight) hours as needed. 07/26/16   Long, Arlyss RepressJoshua G, MD  lisinopril (ZESTRIL) 20 MG tablet Take 1 tablet (20 mg total) by mouth daily for 30 days. 08/13/18 09/12/18  Dicky DoeFord, Kristy, MD  predniSONE (STERAPRED UNI-PAK 21 TAB) 10 MG (21) TBPK tablet 6 tabs for 1 day, then 5 tabs for 1 das, then 4 tabs for 1 day, then 3 tabs for 1 day, 2 tabs for 1 day, then 1 tab for 1 day 08/31/18    Dahlia ByesBast, Erryn Dickison A, NP  traMADol (ULTRAM) 50 MG tablet Take 1 tablet (50 mg total) by mouth every 6 (six) hours as needed. 03/08/16   Pauline Ausriplett, Tammy, PA-C    Family History Family History  Problem Relation Age of Onset  . Cancer Mother   . Heart failure Father   . Diabetes Other     Social History Social History   Tobacco Use  . Smoking status: Current Every Day Smoker    Packs/day: 0.30    Years: 7.00    Pack years: 2.10    Types: Cigarettes  . Smokeless tobacco: Never Used  Substance Use Topics  . Alcohol use: Yes    Comment: Occasionally  . Drug use: No     Allergies   Codeine; Hydrocodone-acetaminophen; Penicillins; and Tylenol [acetaminophen]   Review of Systems Review of Systems   Physical Exam Triage Vital Signs ED Triage Vitals  Enc Vitals Group     BP 08/31/18  1444 (!) 238/151     Pulse Rate 08/31/18 1444 86     Resp 08/31/18 1444 18     Temp 08/31/18 1444 98.2 F (36.8 C)     Temp Source 08/31/18 1444 Oral     SpO2 08/31/18 1444 92 %     Weight --      Height --      Head Circumference --      Peak Flow --      Pain Score 08/31/18 1446 8     Pain Loc --      Pain Edu? --      Excl. in GC? --    No data found.  Updated Vital Signs BP (!) 238/151 (BP Location: Right Arm) Comment: Pt is in alot of pain & hasnt taken blood pressure medication today  Pulse 86   Temp 98.2 F (36.8 C) (Oral)   Resp 18   LMP 08/24/2018   SpO2 92%   Visual Acuity Right Eye Distance:   Left Eye Distance:   Bilateral Distance:    Right Eye Near:   Left Eye Near:    Bilateral Near:     Physical Exam Vitals signs and nursing note reviewed.  Constitutional:      Appearance: Normal appearance. She is obese.  HENT:     Head: Normocephalic and atraumatic.     Nose: Nose normal.  Eyes:     Conjunctiva/sclera: Conjunctivae normal.  Neck:     Musculoskeletal: Normal range of motion.  Pulmonary:     Effort: Pulmonary effort is normal.  Musculoskeletal:         General: Swelling and tenderness present.     Right lower leg: No edema.     Left lower leg: No edema.     Comments: Limited flexion of the left knee.  Moderate swelling and tenderness to entire patella.  Mild posterior knee pain.  No redness, warmth  Skin:    General: Skin is warm and dry.  Neurological:     General: No focal deficit present.     Mental Status: She is alert.  Psychiatric:        Mood and Affect: Mood normal.      UC Treatments / Results  Labs (all labs ordered are listed, but only abnormal results are displayed) Labs Reviewed - No data to display  EKG None  Radiology No results found.  Procedures Procedures (including critical care time)  Medications Ordered in UC Medications - No data to display  Initial Impression / Assessment and Plan / UC Course  I have reviewed the triage vital signs and the nursing notes.  Pertinent labs & imaging results that were available during my care of the patient were reviewed by me and considered in my medical decision making (see chart for details).     X ray revealed OA and joint effusion.  Offered joint aspiration but pt refused.  Wrapped knee with ace wrap Trial of prednisone to treat.  Rest, Ice, Elevate.  Told to come back if no improvement for joint aspiration.  Final Clinical Impressions(s) / UC Diagnoses   Final diagnoses:  Effusion of left knee  Osteoarthritis of left knee, unspecified osteoarthritis type     Discharge Instructions     X-ray showed osteoarthritis with large effusion We will place you on prednisone taper. Use the Ace wrap, rest, ice and elevate. If the symptoms persist or worsen by Monday you need to come back to have the fluid  drawn off of your knee.      ED Prescriptions    Medication Sig Dispense Auth. Provider   predniSONE (STERAPRED UNI-PAK 21 TAB) 10 MG (21) TBPK tablet 6 tabs for 1 day, then 5 tabs for 1 das, then 4 tabs for 1 day, then 3 tabs for 1 day, 2 tabs for 1  day, then 1 tab for 1 day 21 tablet Dahlia Byes A, NP     Controlled Substance Prescriptions [place] Controlled Substance Registry consulted? no   Janace Aris, NP 09/03/18 0818

## 2018-08-31 NOTE — ED Triage Notes (Signed)
Pt presents with ongoing left knee pain from unknown source for past few days.

## 2018-08-31 NOTE — Discharge Instructions (Signed)
X-ray showed osteoarthritis with large effusion We will place you on prednisone taper. Use the Ace wrap, rest, ice and elevate. If the symptoms persist or worsen by Monday you need to come back to have the fluid drawn off of your knee.

## 2019-06-30 ENCOUNTER — Inpatient Hospital Stay (HOSPITAL_COMMUNITY): Payer: Medicaid Other

## 2019-06-30 ENCOUNTER — Encounter (HOSPITAL_COMMUNITY): Payer: Self-pay | Admitting: *Deleted

## 2019-06-30 ENCOUNTER — Inpatient Hospital Stay (HOSPITAL_COMMUNITY)
Admission: EM | Admit: 2019-06-30 | Discharge: 2019-07-02 | DRG: 291 | Disposition: A | Discharge status: Home / Self Care | Payer: Medicaid Other | Attending: Internal Medicine | Admitting: Internal Medicine

## 2019-06-30 ENCOUNTER — Other Ambulatory Visit: Payer: Self-pay

## 2019-06-30 ENCOUNTER — Emergency Department (HOSPITAL_COMMUNITY): Payer: Medicaid Other

## 2019-06-30 DIAGNOSIS — N183 Chronic kidney disease, stage 3 unspecified: Secondary | ICD-10-CM

## 2019-06-30 DIAGNOSIS — Z9114 Patient's other noncompliance with medication regimen: Secondary | ICD-10-CM | POA: Diagnosis not present

## 2019-06-30 DIAGNOSIS — N1831 Chronic kidney disease, stage 3a: Secondary | ICD-10-CM | POA: Diagnosis present

## 2019-06-30 DIAGNOSIS — Z720 Tobacco use: Secondary | ICD-10-CM

## 2019-06-30 DIAGNOSIS — N179 Acute kidney failure, unspecified: Secondary | ICD-10-CM

## 2019-06-30 DIAGNOSIS — F1410 Cocaine abuse, uncomplicated: Secondary | ICD-10-CM

## 2019-06-30 DIAGNOSIS — I160 Hypertensive urgency: Secondary | ICD-10-CM | POA: Diagnosis present

## 2019-06-30 DIAGNOSIS — Z20822 Contact with and (suspected) exposure to covid-19: Secondary | ICD-10-CM | POA: Diagnosis present

## 2019-06-30 DIAGNOSIS — I5021 Acute systolic (congestive) heart failure: Secondary | ICD-10-CM | POA: Diagnosis present

## 2019-06-30 DIAGNOSIS — G4730 Sleep apnea, unspecified: Secondary | ICD-10-CM | POA: Diagnosis present

## 2019-06-30 DIAGNOSIS — F17210 Nicotine dependence, cigarettes, uncomplicated: Secondary | ICD-10-CM | POA: Diagnosis present

## 2019-06-30 DIAGNOSIS — I130 Hypertensive heart and chronic kidney disease with heart failure and stage 1 through stage 4 chronic kidney disease, or unspecified chronic kidney disease: Principal | ICD-10-CM | POA: Diagnosis present

## 2019-06-30 DIAGNOSIS — I509 Heart failure, unspecified: Secondary | ICD-10-CM

## 2019-06-30 DIAGNOSIS — Z79899 Other long term (current) drug therapy: Secondary | ICD-10-CM

## 2019-06-30 DIAGNOSIS — Z6841 Body Mass Index (BMI) 40.0 and over, adult: Secondary | ICD-10-CM | POA: Diagnosis not present

## 2019-06-30 LAB — CBC WITH DIFFERENTIAL/PLATELET
Abs Immature Granulocytes: 0.02 10*3/uL (ref 0.00–0.07)
Basophils Absolute: 0 10*3/uL (ref 0.0–0.1)
Basophils Relative: 0 %
Eosinophils Absolute: 0.2 10*3/uL (ref 0.0–0.5)
Eosinophils Relative: 3 %
HCT: 41.3 % (ref 36.0–46.0)
Hemoglobin: 12.1 g/dL (ref 12.0–15.0)
Immature Granulocytes: 0 %
Lymphocytes Relative: 29 %
Lymphs Abs: 2.1 10*3/uL (ref 0.7–4.0)
MCH: 24.2 pg — ABNORMAL LOW (ref 26.0–34.0)
MCHC: 29.3 g/dL — ABNORMAL LOW (ref 30.0–36.0)
MCV: 82.4 fL (ref 80.0–100.0)
Monocytes Absolute: 0.7 10*3/uL (ref 0.1–1.0)
Monocytes Relative: 9 %
Neutro Abs: 4.2 10*3/uL (ref 1.7–7.7)
Neutrophils Relative %: 59 %
Platelets: 230 10*3/uL (ref 150–400)
RBC: 5.01 MIL/uL (ref 3.87–5.11)
RDW: 17.6 % — ABNORMAL HIGH (ref 11.5–15.5)
WBC: 7.2 10*3/uL (ref 4.0–10.5)
nRBC: 0 % (ref 0.0–0.2)

## 2019-06-30 LAB — RAPID URINE DRUG SCREEN, HOSP PERFORMED
Amphetamines: NOT DETECTED
Barbiturates: NOT DETECTED
Benzodiazepines: NOT DETECTED
Cocaine: POSITIVE — AB
Opiates: NOT DETECTED
Tetrahydrocannabinol: NOT DETECTED

## 2019-06-30 LAB — URINALYSIS, COMPLETE (UACMP) WITH MICROSCOPIC
Bacteria, UA: NONE SEEN
Bilirubin Urine: NEGATIVE
Glucose, UA: NEGATIVE mg/dL
Hgb urine dipstick: NEGATIVE
Ketones, ur: NEGATIVE mg/dL
Leukocytes,Ua: NEGATIVE
Nitrite: NEGATIVE
Protein, ur: NEGATIVE mg/dL
Specific Gravity, Urine: 1.005 (ref 1.005–1.030)
pH: 7 (ref 5.0–8.0)

## 2019-06-30 LAB — BRAIN NATRIURETIC PEPTIDE: B Natriuretic Peptide: 1314 pg/mL — ABNORMAL HIGH (ref 0.0–100.0)

## 2019-06-30 LAB — PROTEIN / CREATININE RATIO, URINE
Creatinine, Urine: 21.58 mg/dL
Protein Creatinine Ratio: 0.6 mg/mg{Cre} — ABNORMAL HIGH (ref 0.00–0.15)
Total Protein, Urine: 13 mg/dL

## 2019-06-30 LAB — TROPONIN I (HIGH SENSITIVITY)
Troponin I (High Sensitivity): 85 ng/L — ABNORMAL HIGH (ref <18)
Troponin I (High Sensitivity): 84 ng/L — ABNORMAL HIGH (ref <18)

## 2019-06-30 LAB — BASIC METABOLIC PANEL
Anion gap: 6 (ref 5–15)
BUN: 23 mg/dL — ABNORMAL HIGH (ref 6–20)
CO2: 25 mmol/L (ref 22–32)
Calcium: 8.6 mg/dL — ABNORMAL LOW (ref 8.9–10.3)
Chloride: 107 mmol/L (ref 98–111)
Creatinine, Ser: 2.23 mg/dL — ABNORMAL HIGH (ref 0.44–1.00)
GFR calc Af Amer: 32 mL/min — ABNORMAL LOW (ref >60)
GFR calc non Af Amer: 28 mL/min — ABNORMAL LOW (ref >60)
Glucose, Bld: 98 mg/dL (ref 70–99)
Potassium: 4.4 mmol/L (ref 3.5–5.1)
Sodium: 138 mmol/L (ref 135–145)

## 2019-06-30 LAB — D-DIMER, QUANTITATIVE: D-Dimer, Quant: 2.71 ug/mL-FEU — ABNORMAL HIGH (ref 0.00–0.50)

## 2019-06-30 LAB — I-STAT BETA HCG BLOOD, ED (MC, WL, AP ONLY): I-stat hCG, quantitative: <5 m[IU]/mL (ref <5)

## 2019-06-30 LAB — RESPIRATORY PANEL BY RT PCR (FLU A&B, COVID)
Influenza A by PCR: NEGATIVE
Influenza B by PCR: NEGATIVE
SARS Coronavirus 2 by RT PCR: NEGATIVE

## 2019-06-30 LAB — HIV ANTIBODY (ROUTINE TESTING W REFLEX): HIV Screen 4th Generation wRfx: NONREACTIVE

## 2019-06-30 LAB — POC SARS CORONAVIRUS 2 AG -  ED: SARS Coronavirus 2 Ag: NEGATIVE

## 2019-06-30 MED ORDER — HYDRALAZINE HCL 20 MG/ML IJ SOLN
20.0000 mg | Freq: Once | INTRAMUSCULAR | Status: AC
  Administered 2019-06-30: 14:00:00 20 mg via INTRAVENOUS
  Filled 2019-06-30: qty 1

## 2019-06-30 MED ORDER — HYDROXYZINE HCL 25 MG PO TABS
25.0000 mg | ORAL_TABLET | Freq: Four times a day (QID) | ORAL | Status: DC | PRN
  Administered 2019-06-30 – 2019-07-01 (×3): 25 mg via ORAL
  Filled 2019-06-30 (×4): qty 1

## 2019-06-30 MED ORDER — SODIUM CHLORIDE 0.9% FLUSH
3.0000 mL | Freq: Two times a day (BID) | INTRAVENOUS | Status: DC
  Administered 2019-06-30 – 2019-07-02 (×4): 3 mL via INTRAVENOUS

## 2019-06-30 MED ORDER — AMLODIPINE BESYLATE 5 MG PO TABS
10.0000 mg | ORAL_TABLET | Freq: Every day | ORAL | Status: DC
  Administered 2019-06-30 – 2019-07-02 (×3): 10 mg via ORAL
  Filled 2019-06-30 (×3): qty 2

## 2019-06-30 MED ORDER — SODIUM CHLORIDE 0.9% FLUSH: 3.0000 mL | INTRAVENOUS | Status: DC | PRN

## 2019-06-30 MED ORDER — LORAZEPAM 2 MG/ML IJ SOLN
0.5000 mg | Freq: Once | INTRAMUSCULAR | Status: AC
  Administered 2019-06-30: 0.5 mg via INTRAVENOUS
  Filled 2019-06-30: qty 1

## 2019-06-30 MED ORDER — FUROSEMIDE 10 MG/ML IJ SOLN
40.0000 mg | Freq: Every day | INTRAMUSCULAR | Status: DC
  Administered 2019-07-01 – 2019-07-02 (×2): 40 mg via INTRAVENOUS
  Filled 2019-06-30 (×2): qty 4

## 2019-06-30 MED ORDER — ALBUTEROL SULFATE HFA 108 (90 BASE) MCG/ACT IN AERS
2.0000 | INHALATION_SPRAY | RESPIRATORY_TRACT | Status: DC | PRN
  Administered 2019-06-30: 13:00:00 2 via RESPIRATORY_TRACT
  Filled 2019-06-30: qty 6.7

## 2019-06-30 MED ORDER — TRAMADOL HCL 50 MG PO TABS
50.0000 mg | ORAL_TABLET | Freq: Four times a day (QID) | ORAL | Status: DC | PRN
  Administered 2019-07-01: 50 mg via ORAL
  Filled 2019-06-30: qty 1

## 2019-06-30 MED ORDER — DILTIAZEM HCL 25 MG/5ML IV SOLN
20.0000 mg | Freq: Once | INTRAVENOUS | Status: AC
  Administered 2019-06-30: 13:00:00 20 mg via INTRAVENOUS
  Filled 2019-06-30: qty 5

## 2019-06-30 MED ORDER — ONDANSETRON HCL 4 MG/2ML IJ SOLN: 4.0000 mg | Freq: Four times a day (QID) | INTRAMUSCULAR | Status: DC | PRN

## 2019-06-30 MED ORDER — FUROSEMIDE 10 MG/ML IJ SOLN
40.0000 mg | Freq: Once | INTRAMUSCULAR | Status: AC
  Administered 2019-06-30: 14:00:00 40 mg via INTRAVENOUS
  Filled 2019-06-30: qty 4

## 2019-06-30 MED ORDER — HEPARIN SODIUM (PORCINE) 5000 UNIT/ML IJ SOLN
5000.0000 [IU] | Freq: Three times a day (TID) | INTRAMUSCULAR | Status: DC
  Administered 2019-06-30 – 2019-07-02 (×5): 5000 [IU] via SUBCUTANEOUS
  Filled 2019-06-30 (×6): qty 1

## 2019-06-30 MED ORDER — SODIUM CHLORIDE 0.9 % IV SOLN
250.0000 mL | INTRAVENOUS | Status: DC | PRN
  Administered 2019-07-01: 11:00:00 250 mL via INTRAVENOUS

## 2019-06-30 MED ORDER — HYDRALAZINE HCL 20 MG/ML IJ SOLN
10.0000 mg | Freq: Four times a day (QID) | INTRAMUSCULAR | Status: DC | PRN
  Administered 2019-07-01: 10 mg via INTRAVENOUS
  Filled 2019-06-30 (×2): qty 1

## 2019-06-30 MED ORDER — CARVEDILOL 3.125 MG PO TABS
3.1250 mg | ORAL_TABLET | Freq: Two times a day (BID) | ORAL | Status: DC
  Administered 2019-06-30 – 2019-07-01 (×2): 3.125 mg via ORAL
  Filled 2019-06-30 (×2): qty 1

## 2019-06-30 NOTE — Progress Notes (Signed)
Patient stated she wants to leave AMA. Patient is very anxious and indicated that she is unable to "stay here for two days" MD has been notified and received verbal order for Lorazepam 0.5 mg IV once. Also discussed with patient the risks of leaving AMA including worsening of symptoms and even the possibility of death. Patient indicated that she would "try the ativan" Will continue to monitor patient.

## 2019-06-30 NOTE — ED Provider Notes (Signed)
[facility] EMERGENCY DEPARTMENT Provider Note   CSN: [id number] Arrival date & time: 06/30/19  1047     History Chief Complaint  Patient presents with  . Shortness of Breath    Teruko Joswick Thobe is a 34 y.o. female with a history of hypertension and sleep apnea who has run out of her blood pressure medications approximately 2 weeks ago presenting with an approximate 2-week history of slowly worsening shortness of breath which became significantly worsened yesterday evening.  She has had an occasional cough which is been productive of a clear sputum.  She denies chest pain, she does endorse wheezing and viral at her aunts albuterol using her nebulizer machine this morning at 8 AM with some improvement of the shortness of breath.  She denies leg pain or swelling, no palpitations, nausea, vomiting or diaphoresis.  Her symptoms are worsened with exertion.  She sleeps propped on 3 pillows chronically, not secondary to shortness of breath, she simply feels more comfortable in this position.  Additionally she denies headache, fevers or chills, no recent Covid exposures.  The history is provided by the patient.    HPI: A 34 year old patient with a history of hypertension and obesity presents for evaluation of chest pain. Initial onset of pain was more than 6 hours ago. The patient's chest pain is not worse with exertion. The patient's chest pain is not middle- or left-sided, is not well-localized, is not described as heaviness/pressure/tightness, is not sharp and does not radiate to the arms/jaw/neck. The patient does not complain of nausea and denies diaphoresis. The patient has no history of stroke, has no history of peripheral artery disease, has not smoked in the past 90 days, denies any history of treated diabetes, has no relevant family history of coronary artery disease (first degree relative at less than age 37) and has no history of hypercholesterolemia.   Past Medical History:  Diagnosis  Date  . Hypertension   . Sleep apnea     There are no problems to display for this patient.   History reviewed. No pertinent surgical history.   OB History    Gravida      Para      Term      Preterm      AB      Living  0     SAB      TAB      Ectopic      Multiple      Live Births              Family History  Problem Relation Age of Onset  . Cancer Mother   . Heart failure Father   . Diabetes Other     Social History   Tobacco Use  . Smoking status: Current Every Day Smoker    Packs/day: 0.30    Years: 7.00    Pack years: 2.10    Types: Cigarettes  . Smokeless tobacco: Never Used  Substance Use Topics  . Alcohol use: Yes    Comment: Occasionally  . Drug use: No    Home Medications Prior to Admission medications   Medication Sig Start Date End Date Taking? Authorizing Provider  amLODipine (NORVASC) 10 MG tablet Take 1 tablet (10 mg total) by mouth daily. 08/13/18   Doneta Public, MD  hydrochlorothiazide (HYDRODIURIL) 12.5 MG tablet Take 1 tablet (12.5 mg total) by mouth daily for 30 days. 08/13/18 09/12/18  Doneta Public, MD  ibuprofen (ADVIL) 200 MG tablet Take 600  mg by mouth every 6 (six) hours as needed for moderate pain.    [provider]  ibuprofen (ADVIL,MOTRIN) 800 MG tablet Take 1 tablet (800 mg total) by mouth every 8 (eight) hours as needed. 07/26/16   Long, Arlyss Repress, MD  lisinopril (ZESTRIL) 20 MG tablet Take 1 tablet (20 mg total) by mouth daily for 30 days. 08/13/18 09/12/18  Dicky Doe, MD  predniSONE (STERAPRED UNI-PAK 21 TAB) 10 MG (21) TBPK tablet 6 tabs for 1 day, then 5 tabs for 1 das, then 4 tabs for 1 day, then 3 tabs for 1 day, 2 tabs for 1 day, then 1 tab for 1 day 08/31/18   Dahlia Byes A, NP  traMADol (ULTRAM) 50 MG tablet Take 1 tablet (50 mg total) by mouth every 6 (six) hours as needed. 03/08/16   Triplett, Tammy, PA-C    Allergies    Codeine, Hydrocodone-acetaminophen, Penicillins, and Tylenol  [acetaminophen]  Review of Systems   Review of Systems  Constitutional: Negative for chills and fever.  HENT: Negative for congestion and sore throat.   Eyes: Negative.   Respiratory: Positive for cough, shortness of breath and wheezing. Negative for chest tightness.   Cardiovascular: Negative for chest pain, palpitations and leg swelling.  Gastrointestinal: Negative for abdominal pain, nausea and vomiting.  Genitourinary: Negative.   Musculoskeletal: Negative for arthralgias, joint swelling and neck pain.  Skin: Negative.  Negative for rash and wound.  Neurological: Negative for dizziness, weakness, light-headedness, numbness and headaches.  Psychiatric/Behavioral: Negative.     Physical Exam Updated Vital Signs BP (!) 184/126 (BP Location: Left Arm)   Pulse 75   Temp 98.7 F (37.1 C) (Oral)   Resp 14   Ht 5\' 6"  (1.676 m)   Wt (!) 160.6 kg   LMP 05/06/2019   SpO2 92%   BMI 57.14 kg/m   Physical Exam Vitals and nursing note reviewed.  Constitutional:      Appearance: She is well-developed.  HENT:     Head: Normocephalic and atraumatic.  Eyes:     Conjunctiva/sclera: Conjunctivae normal.  Cardiovascular:     Rate and Rhythm: Normal rate and regular rhythm.     Heart sounds: Normal heart sounds.  Pulmonary:     Effort: Pulmonary effort is normal. Tachypnea present.     Breath sounds: Decreased breath sounds present. No wheezing, rhonchi or rales.  Chest:     Chest wall: No tenderness.  Abdominal:     General: Bowel sounds are normal.     Palpations: Abdomen is soft.     Tenderness: There is no abdominal tenderness.  Musculoskeletal:        General: Normal range of motion.     Cervical back: Normal range of motion.     Right lower leg: No tenderness. No edema.     Left lower leg: No tenderness. No edema.  Skin:    General: Skin is warm and dry.  Neurological:     Mental Status: She is alert.     ED Results / Procedures / Treatments   Labs (all labs  ordered are listed, but only abnormal results are displayed) Labs Reviewed  BASIC METABOLIC PANEL - Abnormal; Notable for the following components:      Result Value   BUN 23 (*)    Creatinine, Ser 2.23 (*)    Calcium 8.6 (*)    GFR calc non Af Amer 28 (*)    GFR calc Af Amer 32 (*)  All other components within normal limits  CBC WITH DIFFERENTIAL/PLATELET - Abnormal; Notable for the following components:   MCH 24.2 (*)    MCHC 29.3 (*)    RDW 17.6 (*)    All other components within normal limits  BRAIN NATRIURETIC PEPTIDE - Abnormal; Notable for the following components:   B Natriuretic Peptide 1,314.0 (*)    All other components within normal limits  D-DIMER, QUANTITATIVE (NOT AT [facility]) - Abnormal; Notable for the following components:   D-Dimer, Quant 2.71 (*)    All other components within normal limits  TROPONIN I (HIGH SENSITIVITY) - Abnormal; Notable for the following components:   Troponin I (High Sensitivity) 85 (*)    All other components within normal limits  RESPIRATORY PANEL BY RT PCR (FLU A&B, COVID)  POC SARS CORONAVIRUS 2 AG -  ED  I-STAT BETA HCG BLOOD, ED ([facility], [facility], [facility] ONLY)  TROPONIN I (HIGH SENSITIVITY)    EKG EKG Interpretation  Date/Time:  Sunday June 30 2019 11:31:29 EDT Ventricular Rate:  92 PR Interval:    QRS Duration: 105 QT Interval:  404 QTC Calculation: 500 R Axis:   -21 Text Interpretation: Sinus rhythm Probable left atrial enlargement Left ventricular hypertrophy Abnormal T, consider ischemia, lateral leads Prolonged QT interval Since last tracing LVH more prominent Confirmed by Eber Hong ([id number]) on 06/30/2019 12:08:40 PM   Radiology DG Chest Port 1 View  Result Date: 06/30/2019 CLINICAL DATA:  Shortness of breath EXAM: PORTABLE CHEST 1 VIEW COMPARISON:  Chest radiograph dated 07/26/2016 FINDINGS: The heart is borderline enlarged accounting for technique. The lungs are hypoinflated. The left costophrenic angle is obscured which may  reflect a small pleural effusion or atelectasis/airspace disease. Mild diffuse bilateral interstitial opacities may reflect vascular crowding. There is no right pleural effusion. There is no pneumothorax. The osseous structures are intact. IMPRESSION: 1. Possible small left pleural effusion and atelectasis/airspace disease in the left lung base. 2. Borderline cardiomegaly. 3. Diffuse interstitial opacities may reflect vascular crowding. Electronically Signed   By: Romona Curls M.D.   On: 06/30/2019 12:43    Procedures Procedures (including critical care time)  Medications Ordered in ED Medications  albuterol (VENTOLIN HFA) 108 (90 Base) MCG/ACT inhaler 2 puff (2 puffs Inhalation Given 06/30/19 1250)  hydrALAZINE (APRESOLINE) injection 20 mg (has no administration in time range)  diltiazem (CARDIZEM) injection 20 mg (20 mg Intravenous Given 06/30/19 1243)  furosemide (LASIX) injection 40 mg (40 mg Intravenous Given 06/30/19 1338)    ED Course  I have reviewed the triage vital signs and the nursing notes.  Pertinent labs & imaging results that were available during my care of the patient were reviewed by me and considered in my medical decision making (see chart for details).    MDM Rules/Calculators/A&P HEAR Score: 2                     Final Clinical Impression(s) / ED Diagnoses Final diagnoses:  Acute congestive heart failure, unspecified heart failure type (HCC)  AKI (acute kidney injury) (HCC)  Hypertensive urgency   Patient with acute and apparent new onset CHF, also with acute kidney injury.  She has been noncompliant with her BP medications as she ran out of her prescriptions 2 weeks ago.  She denies chest pain.  Her symptoms have been slowly worsening but then became severe yesterday evening.  She denies pleuritic pain.  Her D-dimer is significantly elevated, but with her creatinine currently at 2.23 she will not  tolerate CT angio chest, will need V/Q scanning to assess for  possible PE.  Discussed results with patient who is agreeable with admission.  Her initial Covid test is negative, her confirmatory test is pending.  She was initially given diltiazem IV to treat her blood pressure, did not respond to this medication.  Hydralazine added for hopeful diastolic improvement.  She was also given IV Lasix to assist with diuresis.  2:23 PM Pt's troponin also elevated at 85 which has just resulted.  Second troponin ordered given this elevated result. Pt denies cp,   Hear score 2. sx of one week's duration, worse since ytd. Doubt this represents ACS.    Discussed with Dr. Arbutus Leas with the hospitalist group who accepts pt for admission.     Rx / DC Orders ED Discharge Orders    None       Victoriano Lain 06/30/19 1424    Eber Hong, MD 07/01/19 1721

## 2019-06-30 NOTE — ED Provider Notes (Signed)
This patient is a 34 year old female, she is very pleasant, morbidly obese, history of hypertension and has evidence of on her EKG of previously having left ventricular hypertrophy.  Today's EKG shows ongoing LVH with T wave inversions.  She has had increasing shortness of breath and orthopnea for the last couple of weeks.  On exam the patient is severely hypertensive at 184/126, ran out of her medications about 2-1/2 weeks ago, does not have a local family doctor.  She has mild edema of the legs, she appears mildly tachypneic, lungs are overall very clear, blood pressure is severely elevated.  Labs reviewed, acute kidney injury, likely congestive heart failure related to her hypertensive condition.  She will need to be admitted to the hospital for blood pressure control, likely need some diuresis, her D-dimer is significantly elevated and she may need further work-up for pulmonary embolism though she cannot have an angiogram at this time given her renal function.  We will admit to the hospitalist   Medical screening examination/treatment/procedure(s) were conducted as a shared visit with non-physician practitioner(s) and myself.  I personally evaluated the patient during the encounter.  Clinical Impression:   Final diagnoses:  Acute congestive heart failure, unspecified heart failure type (HCC)  AKI (acute kidney injury) (HCC)  Hypertensive urgency         Eber Hong, MD 07/01/19 1720

## 2019-06-30 NOTE — Progress Notes (Signed)
Patient's effort peakflow 180 L/min.

## 2019-06-30 NOTE — H&P (Signed)
History and Physical  [name] RJJ:[id number] DOB: 25-Dec-1985 DOA: 06/30/2019   PCP: Patient, No Pcp Per   Patient coming from: Home  Chief Complaint: sob  HPI:  Samantha Mccarthy is a 34 y.o. female with medical history of hypertension, OSA, and gouty arthritis presenting with 2-week history of shortness of breath that has gradually worsened to the point where she is more short of breath even at rest.  The patient denies any fevers, chills, headache, chest pain, nausea, vomiting, diarrhea, abdominal pain, dysuria, hematuria.  She has not had any recent travels.  She denies any sick contacts.  She denies any worsening lower extremity edema, but does complain of some increase in abdominal girth and tight fitting close around her abdomen.  In addition, the patient has been complaining of PND for the past 3 nights prior to admission.  She has had some orthopnea type symptoms.  She denies any coughing or hemoptysis.  She continues to smoke approximately 3 to 4 cigarettes/day.  She has an approximately 7-pack-year history.  She denies any illegal drug use.  She has been taking Advil approximately 2 days/week for the past 2 to 3 weeks for knee pain. She states that she has not seen a physician for over 7 months.  She currently does not have a primary care provider.  She states that she has been out of her antihypertensive medications for at least the last 4 months. In the emergency department, the patient was afebrile hemodynamically stable with oxygen saturation 97-the patient's blood pressure was up to 218/133--99% on room air.Marland Kitchen  BMP showed a serum creatinine 2.23.  Otherwise electrolytes were unremarkable.  WBC 7.2, hemoglobin 12.1, platelets 230,000.  Troponin 85.  EKG shows sinus rhythm with nonspecific T wave inversions.  BMP was 1314.  Chest x-ray showed increased vascular congestion.  The patient was given hydralazine 20 mg IV x1, diltiazem 20 mg IV x1, and furosemide 40 mg IV  x1.  Assessment/Plan: Acute CHF, type unspecified -Work-up in progress -Echocardiogram -Continue IV furosemide 40 mg daily -Daily weights -Accurate I's and O's  Elevated D-dimer -Low clinical suspicion for PE -Wells Score = 0 -VQ scan if no improvement in symptoms -CT chest without contrast  Acute on chronic renal failure--CKD stage IIIa -Suspect the patient likely has progression of underlying CKD -Previous serum creatinine 1.58 on 08/13/2018 -Renal ultrasound -Urinalysis -Urine protein creatinine ratio  Hypertensive urgency -Restart amlodipine -Start carvedilol low-dose -Hydralazine as needed SBP >180 -Avoid precipitous drop in blood pressures -TSH -Free T4  Morbid obesity -BMI 57.14 -Lifestyle modification  Tobacco abuse -Tobacco cessation discussed    Past Medical History:  Diagnosis Date  . Hypertension   . Sleep apnea    History reviewed. No pertinent surgical history. Social History:  reports that she has been smoking cigarettes. She has a 2.10 pack-year smoking history. She has never used smokeless tobacco. She reports current alcohol use. She reports that she does not use drugs.   Family History  Problem Relation Age of Onset  . Cancer Mother   . Heart failure Father   . Diabetes Other      Allergies  Allergen Reactions  . Codeine Anaphylaxis  . Hydrocodone-Acetaminophen Shortness Of Breath, Itching and Swelling  . Penicillins Hives    Has patient had a PCN reaction causing immediate rash, facial/tongue/throat swelling, SOB or lightheadedness with hypotension: No Has patient had a PCN reaction causing severe rash involving mucus membranes or skin necrosis: Yes Has  patient had a PCN reaction that required hospitalization No Has patient had a PCN reaction occurring within the last 10 years: Yes If all of the above answers are "NO", then may proceed with Cephalosporin use.   . Tylenol [Acetaminophen] Hives     Prior to Admission medications    Medication Sig Start Date End Date Taking? Authorizing Provider  amLODipine (NORVASC) 10 MG tablet Take 1 tablet (10 mg total) by mouth daily. 08/13/18  Yes Dicky Doe, MD  hydrochlorothiazide (HYDRODIURIL) 12.5 MG tablet Take 1 tablet (12.5 mg total) by mouth daily for 30 days. 08/13/18 06/30/19 Yes Dicky Doe, MD  ibuprofen (ADVIL) 200 MG tablet Take 600 mg by mouth every 6 (six) hours as needed for moderate pain.   Yes [provider]  ibuprofen (ADVIL,MOTRIN) 800 MG tablet Take 1 tablet (800 mg total) by mouth every 8 (eight) hours as needed. 07/26/16  Yes Long, Arlyss Repress, MD  lisinopril (ZESTRIL) 20 MG tablet Take 1 tablet (20 mg total) by mouth daily for 30 days. 08/13/18 06/30/19 Yes Dicky Doe, MD  traMADol (ULTRAM) 50 MG tablet Take 1 tablet (50 mg total) by mouth every 6 (six) hours as needed. 03/08/16  Yes Triplett, Tammy, PA-C  predniSONE (STERAPRED UNI-PAK 21 TAB) 10 MG (21) TBPK tablet 6 tabs for 1 day, then 5 tabs for 1 das, then 4 tabs for 1 day, then 3 tabs for 1 day, 2 tabs for 1 day, then 1 tab for 1 day Patient not taking: Reported on 06/30/2019 08/31/18   Dahlia Byes A, NP    Review of Systems:  Constitutional:  No weight loss, night sweats, Fevers, chills, fatigue.  Head&Eyes: No headache.  No vision loss.  No eye pain or scotoma ENT:  No Difficulty swallowing,Tooth/dental problems,Sore throat,  No ear ache, post nasal drip,  Cardio-vascular:  No swelling in lower extremities,  dizziness, palpitations  GI:  No  abdominal pain, nausea, vomiting, diarrhea, loss of appetite, hematochezia, melena, heartburn, indigestion, Resp:   No coughing up of blood .No wheezing.No chest wall deformity  Skin:  no rash or lesions.  GU:  no dysuria, change in color of urine, no urgency or frequency. No flank pain.  Musculoskeletal:  No joint pain or swelling. No decreased range of motion. No back pain.  Psych:  No change in mood or affect. No depression or  anxiety. Neurologic: No headache, no dysesthesia, no focal weakness, no vision loss. No syncope  Physical Exam: Vitals:   06/30/19 1245 06/30/19 1250 06/30/19 1300 06/30/19 1315  BP:      Pulse: 92  81 75  Resp: 20  16 14   Temp:      TempSrc:      SpO2: 100% 96% 95% 92%  Weight:      Height:       General:  A&O x 3, NAD, nontoxic, pleasant/cooperative Head/Eye: No conjunctival hemorrhage, no icterus, [place]/AT, No nystagmus ENT:  No icterus,  No thrush, good dentition, no pharyngeal exudate Neck:  No masses, no lymphadenpathy, no bruits CV:  RRR, no rub, no gallop, no S3 Lung: Bibasilar crackles.  No wheezing.  Good air movement Abdomen: soft/NT, +BS, nondistended, no peritoneal signs Ext: No cyanosis, No rashes, No petechiae, No lymphangitis, trace LE edema Neuro: CNII-XII intact, strength 4/5 in bilateral upper and lower extremities, no dysmetria  Labs on Admission:  Basic Metabolic Panel: Recent Labs  Lab 06/30/19 1205  NA 138  K 4.4  CL 107  CO2 25  GLUCOSE 98  BUN 23*  CREATININE 2.23*  CALCIUM 8.6*   Liver Function Tests: No results for input(s): AST, ALT, ALKPHOS, BILITOT, PROT, ALBUMIN in the last 168 hours. No results for input(s): LIPASE, AMYLASE in the last 168 hours. No results for input(s): AMMONIA in the last 168 hours. CBC: Recent Labs  Lab 06/30/19 1205  WBC 7.2  NEUTROABS 4.2  HGB 12.1  HCT 41.3  MCV 82.4  PLT 230   Coagulation Profile: No results for input(s): INR, PROTIME in the last 168 hours. Cardiac Enzymes: No results for input(s): CKTOTAL, CKMB, CKMBINDEX, TROPONINI in the last 168 hours. BNP: Invalid input(s): POCBNP CBG: No results for input(s): GLUCAP in the last 168 hours. Urine analysis:    Component Value Date/Time   COLORURINE STRAW (A) 07/26/2016 0745   APPEARANCEUR CLEAR 07/26/2016 0745   LABSPEC 1.010 07/26/2016 0745   PHURINE 6.0 07/26/2016 0745   GLUCOSEU NEGATIVE 07/26/2016 0745   HGBUR NEGATIVE 07/26/2016 0745    BILIRUBINUR NEGATIVE 07/26/2016 0745   KETONESUR NEGATIVE 07/26/2016 0745   PROTEINUR NEGATIVE 07/26/2016 0745   UROBILINOGEN 1.0 06/30/2013 1835   NITRITE NEGATIVE 07/26/2016 0745   LEUKOCYTESUR NEGATIVE 07/26/2016 0745   Sepsis Labs: @LABRCNTIP (procalcitonin:4,lacticidven:4) ) Recent Results (from the past 240 hour(s))  Respiratory Panel by RT PCR (Flu A&B, Covid) - Nasopharyngeal Swab     Status: None   Collection Time: 06/30/19 12:48 PM   Specimen: Nasopharyngeal Swab  Result Value Ref Range Status   SARS Coronavirus 2 by RT PCR NEGATIVE NEGATIVE Final    Comment: (NOTE) SARS-CoV-2 target nucleic acids are NOT DETECTED. The SARS-CoV-2 RNA is generally detectable in upper respiratoy specimens during the acute phase of infection. The lowest concentration of SARS-CoV-2 viral copies this assay can detect is 131 copies/mL. A negative result does not preclude SARS-Cov-2 infection and should not be used as the sole basis for treatment or other patient management decisions. A negative result may occur with  improper specimen collection/handling, submission of specimen other than nasopharyngeal swab, presence of viral mutation(s) within the areas targeted by this assay, and inadequate number of viral copies (<131 copies/mL). A negative result must be combined with clinical observations, patient history, and epidemiological information. The expected result is Negative. Fact Sheet for Patients:  PinkCheek.be Fact Sheet for Healthcare Providers:  GravelBags.it This test is not yet ap proved or cleared by the [place] FDA and  has been authorized for detection and/or diagnosis of SARS-CoV-2 by FDA under an Emergency Use Authorization (EUA). This EUA will remain  in effect (meaning this test can be used) for the duration of the COVID-19 declaration under Section 564(b)(1) of the Act, 21 U.S.C. section 360bbb-3(b)(1), unless  the authorization is terminated or revoked sooner.    Influenza A by PCR NEGATIVE NEGATIVE Final   Influenza B by PCR NEGATIVE NEGATIVE Final    Comment: (NOTE) The Xpert Xpress SARS-CoV-2/FLU/RSV assay is intended as an aid in  the diagnosis of influenza from Nasopharyngeal swab specimens and  should not be used as a sole basis for treatment. Nasal washings and  aspirates are unacceptable for Xpert Xpress SARS-CoV-2/FLU/RSV  testing. Fact Sheet for Patients: PinkCheek.be Fact Sheet for Healthcare Providers: GravelBags.it This test is not yet approved or cleared by the [place] FDA and  has been authorized for detection and/or diagnosis of SARS-CoV-2 by  FDA under an Emergency Use Authorization (EUA). This EUA will remain  in effect (meaning this test can be used) for the duration of  the  Covid-19 declaration under Section 564(b)(1) of the Act, 21  U.S.C. section 360bbb-3(b)(1), unless the authorization is  terminated or revoked. Performed at [facility], [address], [place], [place] [address]      Radiological Exams on Admission: DG Chest [facility] 1 View  Result Date: 06/30/2019 CLINICAL DATA:  Shortness of breath EXAM: PORTABLE CHEST 1 VIEW COMPARISON:  Chest radiograph dated 07/26/2016 FINDINGS: The heart is borderline enlarged accounting for technique. The lungs are hypoinflated. The left costophrenic angle is obscured which may reflect a small pleural effusion or atelectasis/airspace disease. Mild diffuse bilateral interstitial opacities may reflect vascular crowding. There is no right pleural effusion. There is no pneumothorax. The osseous structures are intact. IMPRESSION: 1. Possible small left pleural effusion and atelectasis/airspace disease in the left lung base. 2. Borderline cardiomegaly. 3. Diffuse interstitial opacities may reflect vascular crowding. Electronically Signed   By: Romona Curls M.D.   On:  06/30/2019 12:43    EKG: Independently reviewed. Sinus, LVH, nonspecific TWI    Time spent:60 minutes Code Status:   FULL Family Communication:  No Family at bedside Disposition Plan: expect 2-3 day hospitalization Consults called: none DVT Prophylaxis: [place] Heparin    Catarina Hartshorn, DO  [facility] Pager [phone]  If 7PM-7AM, please contact night-coverage [email or web address] Password [facility] 06/30/2019, 2:54 PM

## 2019-06-30 NOTE — ED Triage Notes (Signed)
Patient presents to the ED with shortness of breath that began 2 weeks ago.  Patient feels like it has worsened since waking this morning.  Patient denies fever or cough.  Patient denies covid exposure.

## 2019-07-01 ENCOUNTER — Inpatient Hospital Stay (HOSPITAL_COMMUNITY): Payer: Medicaid Other

## 2019-07-01 DIAGNOSIS — I5021 Acute systolic (congestive) heart failure: Secondary | ICD-10-CM

## 2019-07-01 DIAGNOSIS — F1410 Cocaine abuse, uncomplicated: Secondary | ICD-10-CM

## 2019-07-01 LAB — TSH: TSH: 1.134 u[IU]/mL (ref 0.350–4.500)

## 2019-07-01 LAB — T4, FREE: Free T4: 0.94 ng/dL (ref 0.61–1.12)

## 2019-07-01 LAB — BASIC METABOLIC PANEL
Anion gap: 10 (ref 5–15)
BUN: 21 mg/dL — ABNORMAL HIGH (ref 6–20)
CO2: 27 mmol/L (ref 22–32)
Calcium: 9.3 mg/dL (ref 8.9–10.3)
Chloride: 103 mmol/L (ref 98–111)
Creatinine, Ser: 2.15 mg/dL — ABNORMAL HIGH (ref 0.44–1.00)
GFR calc Af Amer: 34 mL/min — ABNORMAL LOW (ref >60)
GFR calc non Af Amer: 29 mL/min — ABNORMAL LOW (ref >60)
Glucose, Bld: 129 mg/dL — ABNORMAL HIGH (ref 70–99)
Potassium: 3.7 mmol/L (ref 3.5–5.1)
Sodium: 140 mmol/L (ref 135–145)

## 2019-07-01 LAB — ECHOCARDIOGRAM COMPLETE
Height: 66 in
Weight: 5897.75 oz

## 2019-07-01 LAB — PROCALCITONIN: Procalcitonin: <0.1 ng/mL

## 2019-07-01 LAB — MAGNESIUM: Magnesium: 1.9 mg/dL (ref 1.7–2.4)

## 2019-07-01 MED ORDER — CARVEDILOL 3.125 MG PO TABS
6.2500 mg | ORAL_TABLET | Freq: Two times a day (BID) | ORAL | Status: DC
  Administered 2019-07-01 – 2019-07-02 (×2): 6.25 mg via ORAL
  Filled 2019-07-01 (×2): qty 2

## 2019-07-01 MED ORDER — METOPROLOL TARTRATE 5 MG/5ML IV SOLN
5.0000 mg | Freq: Once | INTRAVENOUS | Status: AC
  Administered 2019-07-01: 06:00:00 5 mg via INTRAVENOUS
  Filled 2019-07-01: qty 5

## 2019-07-01 MED ORDER — SODIUM CHLORIDE 0.9 % IV SOLN
1.0000 g | INTRAVENOUS | Status: DC
  Administered 2019-07-01: 1 g via INTRAVENOUS
  Filled 2019-07-01: qty 10

## 2019-07-01 MED ORDER — AZITHROMYCIN 250 MG PO TABS
500.0000 mg | ORAL_TABLET | Freq: Every day | ORAL | Status: DC
  Administered 2019-07-01 – 2019-07-02 (×2): 500 mg via ORAL
  Filled 2019-07-01 (×2): qty 2

## 2019-07-01 NOTE — Progress Notes (Signed)
MD notified of current BP 199/121. PRN medication given (see mar), will reassess

## 2019-07-01 NOTE — Progress Notes (Signed)
Notified MD of current BP 157/111. MD ordered additional medication (see mar). Will reassess

## 2019-07-01 NOTE — Progress Notes (Signed)
PROGRESS NOTE  Samantha Mccarthy RKY:[id number] DOB: 06-08-1985 DOA: 06/30/2019 PCP: Patient, No Pcp Per  Brief History:  34 y.o. female with medical history of hypertension, OSA, and gouty arthritis presenting with 2-week history of shortness of breath that has gradually worsened to the point where she is more short of breath even at rest.  The patient denies any fevers, chills, headache, chest pain, nausea, vomiting, diarrhea, abdominal pain, dysuria, hematuria.  She has not had any recent travels.  She denies any sick contacts.  She denies any worsening lower extremity edema, but does complain of some increase in abdominal girth and tight fitting close around her abdomen.  In addition, the patient has been complaining of PND for the past 3 nights prior to admission.  She has had some orthopnea type symptoms.  She denies any coughing or hemoptysis.  She continues to smoke approximately 3 to 4 cigarettes/day.  She has an approximately 7-pack-year history   She has been taking Advil approximately 2 days/week for the past 2 to 3 weeks for knee pain. She states that she has not seen a physician for over 7 months.  She currently does not have a primary care provider.  She states that she has been out of her antihypertensive medications for at least the last 4 months. In the emergency department, the patient was afebrile hemodynamically stable with oxygen saturation 97-the patient's blood pressure was up to 218/133--99% on room air.Marland Kitchen  BMP showed a serum creatinine 2.23.  Otherwise electrolytes were unremarkable.  WBC 7.2, hemoglobin 12.1, platelets 230,000.  Troponin 85.  EKG shows sinus rhythm with nonspecific T wave inversions.  BMP was 1314.  Chest x-ray showed increased vascular congestion.  The patient was given hydralazine 20 mg IV x1, diltiazem 20 mg IV x1, and furosemide 40 mg IV x1.  Assessment/Plan:  Acute systolic CHF -Echocardiogram EF 40-45%, global HK -Continue IV furosemide 40  mg daily -Daily weights -Accurate I's and O's--incomplete  Cocaine abuse -pt states she has only used twice -avoiding BB without alpha blockade -cessation discussed  Elevated D-dimer -Low clinical suspicion for PE -Wells Score = 0 -VQ scan if no improvement in symptoms -CT chest without contrast--mild paraseptal emphysema, GGO in RUL and bilateral LL R>L  Acute on chronic renal failure--CKD stage IIIa -Suspect the patient likely has progression of underlying CKD -Previous serum creatinine 1.58 on 08/13/2018 -Renal ultrasound--no hydronephrosis -Urinalysis--bland -Urine protein creatinine ratio--0.60  Hypertensive urgency -Restart amlodipine -Increase carvedilol low-dose -Hydralazine as needed SBP >180 -Avoid precipitous drop in blood pressures -TSH--1.134 -Free T4--0.94  Morbid obesity -BMI 57.14 -Lifestyle modification  Tobacco abuse -Tobacco cessation discussed      Disposition Plan: Patient From: Home D/C Place: Home 1-2 days Barriers: Not Clinically Stable--remains on IV lasix  Family Communication:   Family at bedside  Consultants:  none  Code Status:  FULL   DVT Prophylaxis:  [place] Heparin    Procedures: As Listed in Progress Note Above  Antibiotics: None      Subjective: Pt is breathing better.  Still has some mild dyspnea on exertion, but improving.  Denies f/c, cp, nv/d, abd pain.  Objective: Vitals:   07/01/19 0500 07/01/19 0501 07/01/19 0934 07/01/19 1124  BP:  (!) 157/111  (!) 159/115  Pulse:  97    Resp:  16    Temp:  99 F (37.2 C)    TempSrc:  Oral    SpO2:  97% 96% 97%  Weight: (!) 167.2 kg     Height:        Intake/Output Summary (Last 24 hours) at 07/01/2019 1713 Last data filed at 07/01/2019 1300 Gross per 24 hour  Intake 840 ml  Output --  Net 840 ml   Weight change:  Exam:   General:  Pt is alert, follows commands appropriately, not in acute distress  HEENT: No icterus, No thrush, No neck mass,  [place]/AT  Cardiovascular: RRR, S1/S2, no rubs, no gallops  Respiratory: fine bibasilar crackles. No wheeze  Abdomen: Soft/+BS, non tender, non distended, no guarding  Extremities: No edema, No lymphangitis, No petechiae, No rashes, no synovitis   Data Reviewed: I have personally reviewed following labs and imaging studies Basic Metabolic Panel: Recent Labs  Lab 06/30/19 1205 07/01/19 0947  NA 138 140  K 4.4 3.7  CL 107 103  CO2 25 27  GLUCOSE 98 129*  BUN 23* 21*  CREATININE 2.23* 2.15*  CALCIUM 8.6* 9.3  MG  --  1.9   Liver Function Tests: No results for input(s): AST, ALT, ALKPHOS, BILITOT, PROT, ALBUMIN in the last 168 hours. No results for input(s): LIPASE, AMYLASE in the last 168 hours. No results for input(s): AMMONIA in the last 168 hours. Coagulation Profile: No results for input(s): INR, PROTIME in the last 168 hours. CBC: Recent Labs  Lab 06/30/19 1205  WBC 7.2  NEUTROABS 4.2  HGB 12.1  HCT 41.3  MCV 82.4  PLT 230   Cardiac Enzymes: No results for input(s): CKTOTAL, CKMB, CKMBINDEX, TROPONINI in the last 168 hours. BNP: Invalid input(s): POCBNP CBG: No results for input(s): GLUCAP in the last 168 hours. HbA1C: No results for input(s): HGBA1C in the last 72 hours. Urine analysis:    Component Value Date/Time   COLORURINE STRAW (A) 06/30/2019 1730   APPEARANCEUR CLEAR 06/30/2019 1730   LABSPEC 1.005 06/30/2019 1730   PHURINE 7.0 06/30/2019 1730   GLUCOSEU NEGATIVE 06/30/2019 1730   HGBUR NEGATIVE 06/30/2019 1730   BILIRUBINUR NEGATIVE 06/30/2019 1730   KETONESUR NEGATIVE 06/30/2019 1730   PROTEINUR NEGATIVE 06/30/2019 1730   UROBILINOGEN 1.0 06/30/2013 1835   NITRITE NEGATIVE 06/30/2019 1730   LEUKOCYTESUR NEGATIVE 06/30/2019 1730   Sepsis Labs: @LABRCNTIP (procalcitonin:4,lacticidven:4) ) Recent Results (from the past 240 hour(s))  Respiratory Panel by RT PCR (Flu A&B, Covid) - Nasopharyngeal Swab     Status: None   Collection Time:  06/30/19 12:48 PM   Specimen: Nasopharyngeal Swab  Result Value Ref Range Status   SARS Coronavirus 2 by RT PCR NEGATIVE NEGATIVE Final    Comment: (NOTE) SARS-CoV-2 target nucleic acids are NOT DETECTED. The SARS-CoV-2 RNA is generally detectable in upper respiratoy specimens during the acute phase of infection. The lowest concentration of SARS-CoV-2 viral copies this assay can detect is 131 copies/mL. A negative result does not preclude SARS-Cov-2 infection and should not be used as the sole basis for treatment or other patient management decisions. A negative result may occur with  improper specimen collection/handling, submission of specimen other than nasopharyngeal swab, presence of viral mutation(s) within the areas targeted by this assay, and inadequate number of viral copies (<131 copies/mL). A negative result must be combined with clinical observations, patient history, and epidemiological information. The expected result is Negative. Fact Sheet for Patients:  PinkCheek.be Fact Sheet for Healthcare Providers:  GravelBags.it This test is not yet ap proved or cleared by the [place] FDA and  has been authorized for detection and/or diagnosis of SARS-CoV-2 by FDA under  an Emergency Use Authorization (EUA). This EUA will remain  in effect (meaning this test can be used) for the duration of the COVID-19 declaration under Section 564(b)(1) of the Act, 21 U.S.C. section 360bbb-3(b)(1), unless the authorization is terminated or revoked sooner.    Influenza A by PCR NEGATIVE NEGATIVE Final   Influenza B by PCR NEGATIVE NEGATIVE Final    Comment: (NOTE) The Xpert Xpress SARS-CoV-2/FLU/RSV assay is intended as an aid in  the diagnosis of influenza from Nasopharyngeal swab specimens and  should not be used as a sole basis for treatment. Nasal washings and  aspirates are unacceptable for Xpert Xpress SARS-CoV-2/FLU/RSV   testing. Fact Sheet for Patients: https://www.moore.com/ Fact Sheet for Healthcare Providers: https://www.young.biz/ This test is not yet approved or cleared by the [place] FDA and  has been authorized for detection and/or diagnosis of SARS-CoV-2 by  FDA under an Emergency Use Authorization (EUA). This EUA will remain  in effect (meaning this test can be used) for the duration of the  Covid-19 declaration under Section 564(b)(1) of the Act, 21  U.S.C. section 360bbb-3(b)(1), unless the authorization is  terminated or revoked. Performed at [facility], [address] [place], [place] [address]      Scheduled Meds: . amLODipine  10 mg Oral Daily  . azithromycin  500 mg Oral Daily  . carvedilol  6.25 mg Oral BID WC  . furosemide  40 mg Intravenous Daily  . heparin  5,000 Units Subcutaneous Q8H  . sodium chloride flush  3 mL Intravenous Q12H   Continuous Infusions: . sodium chloride 250 mL (07/01/19 1110)  . cefTRIAXone (ROCEPHIN)  IV 1 g (07/01/19 1114)    Procedures/Studies: CT CHEST WO CONTRAST  Result Date: 06/30/2019 CLINICAL DATA:  Worsening shortness of breath, possible pleural effusion EXAM: CT CHEST WITHOUT CONTRAST TECHNIQUE: Multidetector CT imaging of the chest was performed following the standard protocol without IV contrast. COMPARISON:  Chest radiograph dated 06/22/2019 FINDINGS: Cardiovascular: Cardiomegaly.  No pericardial effusion. No evidence of thoracic aortic aneurysm. Very mild atherosclerotic calcifications of the aortic arch. Mediastinum/Nodes: Small mediastinal lymph nodes, including a dominant 13 mm short axis right paratracheal node (series 2/image 38) and 13 mm short axis subcarinal node (series 2/image 83), likely reactive. Visualized thyroid is unremarkable. Lungs/Pleura: Mild paraseptal emphysematous changes, upper lung predominant. Faint ground-glass opacities in the right upper lobe (series 4/image 38) and  bilateral lower lobes (series 4/image 37), right lung predominant, favoring mild developing infection/pneumonia. Associated mild mosaic attenuation in the lungs bilaterally, reflecting air trapping. No suspicious pulmonary nodules. No pleural effusions or pneumothorax. Upper Abdomen: Visualized upper abdomen is grossly unremarkable. Musculoskeletal: Visualized osseous structures are within normal limits. IMPRESSION: Suspected mild multifocal pneumonia, as above. No evidence of interstitial edema or pleural effusion. Aortic Atherosclerosis (ICD10-I70.0) and Emphysema (ICD10-J43.9). Electronically Signed   By: Charline Bills M.D.   On: 06/30/2019 19:26   US RENAL  Result Date: 07/01/2019 CLINICAL DATA:  Acute renal failure EXAM: RENAL / URINARY TRACT ULTRASOUND COMPLETE COMPARISON:  None. FINDINGS: Right Kidney: Renal measurements: 10.1 x 5.5 x 6.0 cm = volume: 175.2 mL. Normal renal cortical thickness and echogenicity. There is a 1.6 cm cyst within the superior pole. No hydronephrosis. Left Kidney: Renal measurements: 11.8 x 6.7 x 5.4 cm = volume: 224.5 mL. Echogenicity within normal limits. No mass or hydronephrosis visualized. Bladder: Appears normal for degree of bladder distention. Other: None. IMPRESSION: No hydronephrosis. Electronically Signed   By: Annia Belt M.D.   On:  07/01/2019 09:14   DG Chest Port 1 View  Result Date: 06/30/2019 CLINICAL DATA:  Shortness of breath EXAM: PORTABLE CHEST 1 VIEW COMPARISON:  Chest radiograph dated 07/26/2016 FINDINGS: The heart is borderline enlarged accounting for technique. The lungs are hypoinflated. The left costophrenic angle is obscured which may reflect a small pleural effusion or atelectasis/airspace disease. Mild diffuse bilateral interstitial opacities may reflect vascular crowding. There is no right pleural effusion. There is no pneumothorax. The osseous structures are intact. IMPRESSION: 1. Possible small left pleural effusion and atelectasis/airspace  disease in the left lung base. 2. Borderline cardiomegaly. 3. Diffuse interstitial opacities may reflect vascular crowding. Electronically Signed   By: Romona Curlsyler  Litton M.D.   On: 06/30/2019 12:43   ECHOCARDIOGRAM COMPLETE  Result Date: 07/01/2019    ECHOCARDIOGRAM REPORT   Patient Name:   Samantha Mccarthy Date of Exam: 07/01/2019 Medical Rec #:  [id number]         Height:       66.0 in Accession #:    [id number]        Weight:       368.6 lb Date of Birth:  [birth year]        BSA:          2.593 m Patient Age:    33 years          BP:           159/115 mmHg Patient Gender: F                 HR:           97 bpm. [facility] Procedure: 2D Echo, Cardiac Doppler and Color Doppler Indications:    CHF  History:        Patient has no prior history of Echocardiogram examinations.                 Morbid obesity, Signs/Symptoms:Shortness of Breath; Risk                 Factors:Hypertension, Current Smoker and Sleep Apnea. Renal                 failure.  Sonographer:    Lavenia AtlasBrooke Strickland RDCS Referring Phys: [phone] Blima Jaimes  Sonographer Comments: Patient is morbidly obese. IMPRESSIONS  1. Poor image quality no definity given . Left ventricular ejection fraction, by estimation, is 40 to 45%. The left ventricle has mildly decreased function. The left ventricle demonstrates global hypokinesis. The left ventricular internal cavity size was mildly dilated. There is severe left ventricular hypertrophy. Left ventricular diastolic parameters were normal.  2. Right ventricular systolic function is normal. The right ventricular size is normal.  3. Left atrial size was moderately dilated.  4. The mitral valve is normal in structure. No evidence of mitral valve regurgitation. No evidence of mitral stenosis.  5. The aortic valve is normal in structure. Aortic valve regurgitation is not visualized. No aortic stenosis is present.  6. The inferior vena cava is normal in size with greater than 50% respiratory variability,  suggesting right atrial pressure of 3 mmHg. FINDINGS  Left Ventricle: Poor image quality no definity given. Left ventricular ejection fraction, by estimation, is 40 to 45%. The left ventricle has mildly decreased function. The left ventricle demonstrates global hypokinesis. The left ventricular internal cavity size was mildly dilated. There is severe left ventricular hypertrophy. Left ventricular diastolic parameters were normal. Right Ventricle: The right ventricular size is normal. No increase  in right ventricular wall thickness. Right ventricular systolic function is normal. Left Atrium: Left atrial size was moderately dilated. Right Atrium: Right atrial size was normal in size. Pericardium: There is no evidence of pericardial effusion. Mitral Valve: The mitral valve is normal in structure. There is mild thickening of the mitral valve leaflet(s). There is mild calcification of the mitral valve leaflet(s). Normal mobility of the mitral valve leaflets. No evidence of mitral valve regurgitation. No evidence of mitral valve stenosis. Tricuspid Valve: The tricuspid valve is normal in structure. Tricuspid valve regurgitation is trivial. No evidence of tricuspid stenosis. Aortic Valve: The aortic valve is normal in structure. Aortic valve regurgitation is not visualized. No aortic stenosis is present. Pulmonic Valve: The pulmonic valve was normal in structure. Pulmonic valve regurgitation is not visualized. No evidence of pulmonic stenosis. Aorta: The aortic root is normal in size and structure. Venous: The inferior vena cava is normal in size with greater than 50% respiratory variability, suggesting right atrial pressure of 3 mmHg. IAS/Shunts: No atrial level shunt detected by color flow Doppler.  LEFT VENTRICLE PLAX 2D LVIDd:         5.25 cm  Diastology LVIDs:         4.41 cm  LV e' lateral:   6.53 cm/s LV PW:         1.90 cm  LV E/e' lateral: 11.5 LV IVS:        1.73 cm  LV e' medial:    4.46 cm/s LVOT diam:     2.30  cm  LV E/e' medial:  16.8 LVOT Area:     4.15 cm  RIGHT VENTRICLE RV Basal diam:  3.17 cm LEFT ATRIUM            Index LA diam:      4.80 cm  1.85 cm/m LA Vol (A2C): 49.1 ml  18.93 ml/m LA Vol (A4C): 107.0 ml 41.26 ml/m   AORTA Ao Root diam: 3.30 cm MITRAL VALVE MV Area (PHT): 5.06 cm    SHUNTS MV Decel Time: 150 msec    Systemic Diam: 2.30 cm MV E velocity: 75.00 cm/s MV A velocity: 43.30 cm/s MV E/A ratio:  1.73 Charlton Haws MD Electronically signed by Charlton Haws MD Signature Date/Time: 07/01/2019/12:59:22 PM    Final     Catarina Hartshorn, DO  [facility]  If 7PM-7AM, please contact night-coverage [email or web address] Password [email or web address] 07/01/2019, 5:13 PM   LOS: 1 day

## 2019-07-01 NOTE — Progress Notes (Signed)
  Echocardiogram 2D Echocardiogram has been performed.  Samantha Mccarthy 07/01/2019, 12:40 PM

## 2019-07-01 NOTE — Discharge Summary (Addendum)
Physician Discharge Summary  Samantha Mccarthy MVH:[id number] DOB: [birth year] DOA: 06/30/2019  PCP: Patient, No Pcp Per  Admit date: 06/30/2019 Discharge date: 07/02/2019  Admitted From: Home Disposition:  Home   Recommendations for Outpatient Follow-up:  1. Follow up with PCP in 1-2 weeks 2. Please obtain BMP/CBC in one week 3. Repeat echo in 3 months    Discharge Condition: Stable CODE STATUS: FULL Diet recommendation: Heart Healthy    Brief/Interim Summary: 35 y.o.femalewith medical history ofhypertension, OSA, and gouty arthritis presenting with 2-week history of shortness of breath that has gradually worsened to the point where she is more short of breath even at rest. The patient denies any fevers, chills, headache, chest pain, nausea, vomiting, diarrhea, abdominal pain, dysuria, hematuria. She has not had any recent travels. She denies any sick contacts. She denies any worsening lower extremity edema, but does complain of some increase in abdominal girth and tight fitting close around her abdomen. In addition, the patient has been complaining of PND for the past 3 nights prior to admission. She has had some orthopnea type symptoms. She denies any coughing or hemoptysis. She continues to smoke approximately 3 to 4 cigarettes/day. She has an approximately 7-pack-year history  She has been taking Advil approximately 2 days/week for the past 2 to 3 weeks for knee pain. She states that she has not seen a physician for over 7 months. She currently does not have a primary care provider. She states that she has been out of her antihypertensive medications for at least the last 4 months. In the emergency department, the patient was afebrile hemodynamically stable with oxygen saturation 97-the patient's blood pressure was up to 218/133--99% on room air.Marland Kitchen BMP showed a serum creatinine 2.23. Otherwise electrolytes were unremarkable. WBC 7.2, hemoglobin 12.1, platelets  230,000. Troponin 85. EKG shows sinus rhythm with nonspecific T wave inversions. BMP was 1314. Chest x-ray showed increased vascular congestion. The patient was given hydralazine 20 mg IV x1, diltiazem 20 mg IV x1, and furosemide 40 mg IV x1.  Discharge Diagnoses:   Acute systolic CHF -Echocardiogram EF 40-45%, global HK -Continue IV furosemide 40 mg daily during hospitalization -d/c home with lasix 20 mg po daily -Daily weights -Accurate I's and O's--incomplete -discharge home with po lasix -repeat echo in 3 months  Cocaine abuse -pt states she has only used twice -avoiding BB without alpha blockade -cessation discussed  Elevated D-dimer -Low clinical suspicion for PE -Wells Score = 0 -VQ scan if no improvement in symptoms -CT chest without contrast--mild paraseptal emphysema, GGO in RUL and bilateral LL R>L -cefdinir and azithro x 3 more days after d/c  Acute on chronic renal failure--CKD stage IIIa -Suspect the patient likely has progression of underlying CKD -Previous serum creatinine 1.58 on 08/13/2018 -Renal ultrasound--no hydronephrosis -Urinalysis--bland -Urine protein creatinine ratio--0.60 -serum creatinine 2.34 on day of dc  Hypertensive urgency -d/c amlodipine due to suppressed EF -Increase carvedilol to 12.5 mg bid -Hydralazine as neededSBP >180 -Avoid precipitous drop in blood pressures -TSH--1.134 -Free T4--0.94  Morbid obesity -BMI 57.14 -Lifestyle modification  Tobacco abuse -Tobacco cessation discussed   Discharge Instructions   Allergies as of 07/02/2019      Reactions   Codeine Anaphylaxis   Hydrocodone-acetaminophen Shortness Of Breath, Itching, Swelling   Penicillins Hives   Has patient had a PCN reaction causing immediate rash, facial/tongue/throat swelling, SOB or lightheadedness with hypotension: No Has patient had a PCN reaction causing severe rash involving mucus membranes or skin necrosis: Yes Has patient had  a PCN  reaction that required hospitalization No Has patient had a PCN reaction occurring within the last 10 years: Yes If all of the above answers are "NO", then may proceed with Cephalosporin use.   Tylenol [acetaminophen] Hives      Medication List    STOP taking these medications   amLODipine 10 MG tablet Commonly known as: NORVASC   hydrochlorothiazide 12.5 MG tablet Commonly known as: HYDRODIURIL   ibuprofen 200 MG tablet Commonly known as: ADVIL   ibuprofen 800 MG tablet Commonly known as: ADVIL   lisinopril 20 MG tablet Commonly known as: ZESTRIL   predniSONE 10 MG (21) Tbpk tablet Commonly known as: STERAPRED UNI-PAK 21 TAB     TAKE these medications   azithromycin 500 MG tablet Commonly known as: ZITHROMAX Take 1 tablet (500 mg total) by mouth daily. Start taking on: July 03, 2019   carvedilol 12.5 MG tablet Commonly known as: COREG Take 1 tablet (12.5 mg total) by mouth 2 (two) times daily with a meal.   cefdinir 300 MG capsule Commonly known as: OMNICEF Take 1 capsule (300 mg total) by mouth 2 (two) times daily.   furosemide 20 MG tablet Commonly known as: LASIX Take 1 tablet (20 mg total) by mouth daily. Start taking on: July 03, 2019   traMADol 50 MG tablet Commonly known as: ULTRAM Take 1 tablet (50 mg total) by mouth every 6 (six) hours as needed.       Allergies  Allergen Reactions  . Codeine Anaphylaxis  . Hydrocodone-Acetaminophen Shortness Of Breath, Itching and Swelling  . Penicillins Hives    Has patient had a PCN reaction causing immediate rash, facial/tongue/throat swelling, SOB or lightheadedness with hypotension: No Has patient had a PCN reaction causing severe rash involving mucus membranes or skin necrosis: Yes Has patient had a PCN reaction that required hospitalization No Has patient had a PCN reaction occurring within the last 10 years: Yes If all of the above answers are "NO", then may proceed with Cephalosporin use.   .  Tylenol [Acetaminophen] Hives    Consultations:  none   Procedures/Studies: CT CHEST WO CONTRAST  Result Date: 06/30/2019 CLINICAL DATA:  Worsening shortness of breath, possible pleural effusion EXAM: CT CHEST WITHOUT CONTRAST TECHNIQUE: Multidetector CT imaging of the chest was performed following the standard protocol without IV contrast. COMPARISON:  Chest radiograph dated 06/22/2019 FINDINGS: Cardiovascular: Cardiomegaly.  No pericardial effusion. No evidence of thoracic aortic aneurysm. Very mild atherosclerotic calcifications of the aortic arch. Mediastinum/Nodes: Small mediastinal lymph nodes, including a dominant 13 mm short axis right paratracheal node (series 2/image 38) and 13 mm short axis subcarinal node (series 2/image 83), likely reactive. Visualized thyroid is unremarkable. Lungs/Pleura: Mild paraseptal emphysematous changes, upper lung predominant. Faint ground-glass opacities in the right upper lobe (series 4/image 38) and bilateral lower lobes (series 4/image 37), right lung predominant, favoring mild developing infection/pneumonia. Associated mild mosaic attenuation in the lungs bilaterally, reflecting air trapping. No suspicious pulmonary nodules. No pleural effusions or pneumothorax. Upper Abdomen: Visualized upper abdomen is grossly unremarkable. Musculoskeletal: Visualized osseous structures are within normal limits. IMPRESSION: Suspected mild multifocal pneumonia, as above. No evidence of interstitial edema or pleural effusion. Aortic Atherosclerosis (ICD10-I70.0) and Emphysema (ICD10-J43.9). Electronically Signed   By: Charline Bills M.D.   On: 06/30/2019 19:26   US RENAL  Result Date: 07/01/2019 CLINICAL DATA:  Acute renal failure EXAM: RENAL / URINARY TRACT ULTRASOUND COMPLETE COMPARISON:  None. FINDINGS: Right Kidney: Renal measurements: 10.1 x 5.5 x  6.0 cm = volume: 175.2 mL. Normal renal cortical thickness and echogenicity. There is a 1.6 cm cyst within the superior  pole. No hydronephrosis. Left Kidney: Renal measurements: 11.8 x 6.7 x 5.4 cm = volume: 224.5 mL. Echogenicity within normal limits. No mass or hydronephrosis visualized. Bladder: Appears normal for degree of bladder distention. Other: None. IMPRESSION: No hydronephrosis. Electronically Signed   By: Annia Belt M.D.   On: 07/01/2019 09:14   DG Chest Port 1 View  Result Date: 06/30/2019 CLINICAL DATA:  Shortness of breath EXAM: PORTABLE CHEST 1 VIEW COMPARISON:  Chest radiograph dated 07/26/2016 FINDINGS: The heart is borderline enlarged accounting for technique. The lungs are hypoinflated. The left costophrenic angle is obscured which may reflect a small pleural effusion or atelectasis/airspace disease. Mild diffuse bilateral interstitial opacities may reflect vascular crowding. There is no right pleural effusion. There is no pneumothorax. The osseous structures are intact. IMPRESSION: 1. Possible small left pleural effusion and atelectasis/airspace disease in the left lung base. 2. Borderline cardiomegaly. 3. Diffuse interstitial opacities may reflect vascular crowding. Electronically Signed   By: Romona Curls M.D.   On: 06/30/2019 12:43   ECHOCARDIOGRAM COMPLETE  Result Date: 07/01/2019    ECHOCARDIOGRAM REPORT   Patient Name:   MAYTHE DERAMO Date of Exam: 07/01/2019 Medical Rec #:  [id number]         Height:       66.0 in Accession #:    [id number]        Weight:       368.6 lb Date of Birth:  April 02, 1986        BSA:          2.593 m Patient Age:    33 years          BP:           159/115 mmHg Patient Gender: F                 HR:           97 bpm. [facility] Procedure: 2D Echo, Cardiac Doppler and Color Doppler Indications:    CHF  History:        Patient has no prior history of Echocardiogram examinations.                 Morbid obesity, Signs/Symptoms:Shortness of Breath; Risk                 Factors:Hypertension, Current Smoker and Sleep Apnea. Renal                 failure.   Sonographer:    Lavenia Atlas RDCS Referring Phys: [phone] Jerrit Horen  Sonographer Comments: Patient is morbidly obese. IMPRESSIONS  1. Poor image quality no definity given . Left ventricular ejection fraction, by estimation, is 40 to 45%. The left ventricle has mildly decreased function. The left ventricle demonstrates global hypokinesis. The left ventricular internal cavity size was mildly dilated. There is severe left ventricular hypertrophy. Left ventricular diastolic parameters were normal.  2. Right ventricular systolic function is normal. The right ventricular size is normal.  3. Left atrial size was moderately dilated.  4. The mitral valve is normal in structure. No evidence of mitral valve regurgitation. No evidence of mitral stenosis.  5. The aortic valve is normal in structure. Aortic valve regurgitation is not visualized. No aortic stenosis is present.  6. The inferior vena cava is normal in size with greater than 50% respiratory  variability, suggesting right atrial pressure of 3 mmHg. FINDINGS  Left Ventricle: Poor image quality no definity given. Left ventricular ejection fraction, by estimation, is 40 to 45%. The left ventricle has mildly decreased function. The left ventricle demonstrates global hypokinesis. The left ventricular internal cavity size was mildly dilated. There is severe left ventricular hypertrophy. Left ventricular diastolic parameters were normal. Right Ventricle: The right ventricular size is normal. No increase in right ventricular wall thickness. Right ventricular systolic function is normal. Left Atrium: Left atrial size was moderately dilated. Right Atrium: Right atrial size was normal in size. Pericardium: There is no evidence of pericardial effusion. Mitral Valve: The mitral valve is normal in structure. There is mild thickening of the mitral valve leaflet(s). There is mild calcification of the mitral valve leaflet(s). Normal mobility of the mitral valve leaflets. No evidence  of mitral valve regurgitation. No evidence of mitral valve stenosis. Tricuspid Valve: The tricuspid valve is normal in structure. Tricuspid valve regurgitation is trivial. No evidence of tricuspid stenosis. Aortic Valve: The aortic valve is normal in structure. Aortic valve regurgitation is not visualized. No aortic stenosis is present. Pulmonic Valve: The pulmonic valve was normal in structure. Pulmonic valve regurgitation is not visualized. No evidence of pulmonic stenosis. Aorta: The aortic root is normal in size and structure. Venous: The inferior vena cava is normal in size with greater than 50% respiratory variability, suggesting right atrial pressure of 3 mmHg. IAS/Shunts: No atrial level shunt detected by color flow Doppler.  LEFT VENTRICLE PLAX 2D LVIDd:         5.25 cm  Diastology LVIDs:         4.41 cm  LV e' lateral:   6.53 cm/s LV PW:         1.90 cm  LV E/e' lateral: 11.5 LV IVS:        1.73 cm  LV e' medial:    4.46 cm/s LVOT diam:     2.30 cm  LV E/e' medial:  16.8 LVOT Area:     4.15 cm  RIGHT VENTRICLE RV Basal diam:  3.17 cm LEFT ATRIUM            Index LA diam:      4.80 cm  1.85 cm/m LA Vol (A2C): 49.1 ml  18.93 ml/m LA Vol (A4C): 107.0 ml 41.26 ml/m   AORTA Ao Root diam: 3.30 cm MITRAL VALVE MV Area (PHT): 5.06 cm    SHUNTS MV Decel Time: 150 msec    Systemic Diam: 2.30 cm MV E velocity: 75.00 cm/s MV A velocity: 43.30 cm/s MV E/A ratio:  1.73 Charlton [facility] MD Electronically signed by Charlton Haws MD Signature Date/Time: 07/01/2019/12:59:22 PM    Final         Discharge Exam: Vitals:   07/02/19 0530 07/02/19 0642  BP: (!) 168/130 (!) 147/116  Pulse: 80 82  Resp: 16   Temp: (!) 97.5 F (36.4 C)   SpO2: 99%    Vitals:   07/01/19 2148 07/02/19 0500 07/02/19 0530 07/02/19 0642  BP: (!) 163/132  (!) 168/130 (!) 147/116  Pulse: 85  80 82  Resp: 16  16   Temp: 98.1 F (36.7 C)  (!) 97.5 F (36.4 C)   TempSrc: Oral  Oral   SpO2: 97%  99%   Weight:  (!) 164.5 kg    Height:         General: Pt is alert, awake, not in acute distress Cardiovascular: RRR, S1/S2 +, no rubs,  no gallops Respiratory: CTA bilaterally, no wheezing, no rhonchi Abdominal: Soft, NT, ND, bowel sounds + Extremities: no edema, no cyanosis   The results of significant diagnostics from this hospitalization (including imaging, microbiology, ancillary and laboratory) are listed below for reference.    Significant Diagnostic Studies: CT CHEST WO CONTRAST  Result Date: 06/30/2019 CLINICAL DATA:  Worsening shortness of breath, possible pleural effusion EXAM: CT CHEST WITHOUT CONTRAST TECHNIQUE: Multidetector CT imaging of the chest was performed following the standard protocol without IV contrast. COMPARISON:  Chest radiograph dated 06/22/2019 FINDINGS: Cardiovascular: Cardiomegaly.  No pericardial effusion. No evidence of thoracic aortic aneurysm. Very mild atherosclerotic calcifications of the aortic arch. Mediastinum/Nodes: Small mediastinal lymph nodes, including a dominant 13 mm short axis right paratracheal node (series 2/image 38) and 13 mm short axis subcarinal node (series 2/image 83), likely reactive. Visualized thyroid is unremarkable. Lungs/Pleura: Mild paraseptal emphysematous changes, upper lung predominant. Faint ground-glass opacities in the right upper lobe (series 4/image 38) and bilateral lower lobes (series 4/image 37), right lung predominant, favoring mild developing infection/pneumonia. Associated mild mosaic attenuation in the lungs bilaterally, reflecting air trapping. No suspicious pulmonary nodules. No pleural effusions or pneumothorax. Upper Abdomen: Visualized upper abdomen is grossly unremarkable. Musculoskeletal: Visualized osseous structures are within normal limits. IMPRESSION: Suspected mild multifocal pneumonia, as above. No evidence of interstitial edema or pleural effusion. Aortic Atherosclerosis (ICD10-I70.0) and Emphysema (ICD10-J43.9). Electronically Signed   By: Charline Bills M.D.   On: 06/30/2019 19:26   US RENAL  Result Date: 07/01/2019 CLINICAL DATA:  Acute renal failure EXAM: RENAL / URINARY TRACT ULTRASOUND COMPLETE COMPARISON:  None. FINDINGS: Right Kidney: Renal measurements: 10.1 x 5.5 x 6.0 cm = volume: 175.2 mL. Normal renal cortical thickness and echogenicity. There is a 1.6 cm cyst within the superior pole. No hydronephrosis. Left Kidney: Renal measurements: 11.8 x 6.7 x 5.4 cm = volume: 224.5 mL. Echogenicity within normal limits. No mass or hydronephrosis visualized. Bladder: Appears normal for degree of bladder distention. Other: None. IMPRESSION: No hydronephrosis. Electronically Signed   By: Annia Belt M.D.   On: 07/01/2019 09:14   DG Chest Port 1 View  Result Date: 06/30/2019 CLINICAL DATA:  Shortness of breath EXAM: PORTABLE CHEST 1 VIEW COMPARISON:  Chest radiograph dated 07/26/2016 FINDINGS: The heart is borderline enlarged accounting for technique. The lungs are hypoinflated. The left costophrenic angle is obscured which may reflect a small pleural effusion or atelectasis/airspace disease. Mild diffuse bilateral interstitial opacities may reflect vascular crowding. There is no right pleural effusion. There is no pneumothorax. The osseous structures are intact. IMPRESSION: 1. Possible small left pleural effusion and atelectasis/airspace disease in the left lung base. 2. Borderline cardiomegaly. 3. Diffuse interstitial opacities may reflect vascular crowding. Electronically Signed   By: Romona Curls M.D.   On: 06/30/2019 12:43   ECHOCARDIOGRAM COMPLETE  Result Date: 07/01/2019    ECHOCARDIOGRAM REPORT   Patient Name:   RAYONNA HELDMAN Date of Exam: 07/01/2019 Medical Rec #:  [id number]         Height:       66.0 in Accession #:    [id number]        Weight:       368.6 lb Date of Birth:  [birth year]        BSA:          2.593 m Patient Age:    33 years          BP:  159/115 mmHg Patient Gender: F                 HR:           97 bpm.  [facility] Procedure: 2D Echo, Cardiac Doppler and Color Doppler Indications:    CHF  History:        Patient has no prior history of Echocardiogram examinations.                 Morbid obesity, Signs/Symptoms:Shortness of Breath; Risk                 Factors:Hypertension, Current Smoker and Sleep Apnea. Renal                 failure.  Sonographer:    Lavenia Atlas RDCS Referring Phys: [phone] Taevion Sikora  Sonographer Comments: Patient is morbidly obese. IMPRESSIONS  1. Poor image quality no definity given . Left ventricular ejection fraction, by estimation, is 40 to 45%. The left ventricle has mildly decreased function. The left ventricle demonstrates global hypokinesis. The left ventricular internal cavity size was mildly dilated. There is severe left ventricular hypertrophy. Left ventricular diastolic parameters were normal.  2. Right ventricular systolic function is normal. The right ventricular size is normal.  3. Left atrial size was moderately dilated.  4. The mitral valve is normal in structure. No evidence of mitral valve regurgitation. No evidence of mitral stenosis.  5. The aortic valve is normal in structure. Aortic valve regurgitation is not visualized. No aortic stenosis is present.  6. The inferior vena cava is normal in size with greater than 50% respiratory variability, suggesting right atrial pressure of 3 mmHg. FINDINGS  Left Ventricle: Poor image quality no definity given. Left ventricular ejection fraction, by estimation, is 40 to 45%. The left ventricle has mildly decreased function. The left ventricle demonstrates global hypokinesis. The left ventricular internal cavity size was mildly dilated. There is severe left ventricular hypertrophy. Left ventricular diastolic parameters were normal. Right Ventricle: The right ventricular size is normal. No increase in right ventricular wall thickness. Right ventricular systolic function is normal. Left Atrium: Left atrial size was  moderately dilated. Right Atrium: Right atrial size was normal in size. Pericardium: There is no evidence of pericardial effusion. Mitral Valve: The mitral valve is normal in structure. There is mild thickening of the mitral valve leaflet(s). There is mild calcification of the mitral valve leaflet(s). Normal mobility of the mitral valve leaflets. No evidence of mitral valve regurgitation. No evidence of mitral valve stenosis. Tricuspid Valve: The tricuspid valve is normal in structure. Tricuspid valve regurgitation is trivial. No evidence of tricuspid stenosis. Aortic Valve: The aortic valve is normal in structure. Aortic valve regurgitation is not visualized. No aortic stenosis is present. Pulmonic Valve: The pulmonic valve was normal in structure. Pulmonic valve regurgitation is not visualized. No evidence of pulmonic stenosis. Aorta: The aortic root is normal in size and structure. Venous: The inferior vena cava is normal in size with greater than 50% respiratory variability, suggesting right atrial pressure of 3 mmHg. IAS/Shunts: No atrial level shunt detected by color flow Doppler.  LEFT VENTRICLE PLAX 2D LVIDd:         5.25 cm  Diastology LVIDs:         4.41 cm  LV e' lateral:   6.53 cm/s LV PW:         1.90 cm  LV E/e' lateral: 11.5 LV IVS:  1.73 cm  LV e' medial:    4.46 cm/s LVOT diam:     2.30 cm  LV E/e' medial:  16.8 LVOT Area:     4.15 cm  RIGHT VENTRICLE RV Basal diam:  3.17 cm LEFT ATRIUM            Index LA diam:      4.80 cm  1.85 cm/m LA Vol (A2C): 49.1 ml  18.93 ml/m LA Vol (A4C): 107.0 ml 41.26 ml/m   AORTA Ao Root diam: 3.30 cm MITRAL VALVE MV Area (PHT): 5.06 cm    SHUNTS MV Decel Time: 150 msec    Systemic Diam: 2.30 cm MV E velocity: 75.00 cm/s MV A velocity: 43.30 cm/s MV E/A ratio:  1.73 [facility] MD Electronically signed by Charlton Haws MD Signature Date/Time: 07/01/2019/12:59:22 PM    Final      Microbiology: Recent Results (from the past 240 hour(s))  Respiratory Panel  by RT PCR (Flu A&B, Covid) - Nasopharyngeal Swab     Status: None   Collection Time: 06/30/19 12:48 PM   Specimen: Nasopharyngeal Swab  Result Value Ref Range Status   SARS Coronavirus 2 by RT PCR NEGATIVE NEGATIVE Final    Comment: (NOTE) SARS-CoV-2 target nucleic acids are NOT DETECTED. The SARS-CoV-2 RNA is generally detectable in upper respiratoy specimens during the acute phase of infection. The lowest concentration of SARS-CoV-2 viral copies this assay can detect is 131 copies/mL. A negative result does not preclude SARS-Cov-2 infection and should not be used as the sole basis for treatment or other patient management decisions. A negative result may occur with  improper specimen collection/handling, submission of specimen other than nasopharyngeal swab, presence of viral mutation(s) within the areas targeted by this assay, and inadequate number of viral copies (<131 copies/mL). A negative result must be combined with clinical observations, patient history, and epidemiological information. The expected result is Negative. Fact Sheet for Patients:  https://www.moore.com/ Fact Sheet for Healthcare Providers:  https://www.young.biz/ This test is not yet ap proved or cleared by the [place] FDA and  has been authorized for detection and/or diagnosis of SARS-CoV-2 by FDA under an Emergency Use Authorization (EUA). This EUA will remain  in effect (meaning this test can be used) for the duration of the COVID-19 declaration under Section 564(b)(1) of the Act, 21 U.S.C. section 360bbb-3(b)(1), unless the authorization is terminated or revoked sooner.    Influenza A by PCR NEGATIVE NEGATIVE Final   Influenza B by PCR NEGATIVE NEGATIVE Final    Comment: (NOTE) The Xpert Xpress SARS-CoV-2/FLU/RSV assay is intended as an aid in  the diagnosis of influenza from Nasopharyngeal swab specimens and  should not be used as a sole basis for treatment.  Nasal washings and  aspirates are unacceptable for Xpert Xpress SARS-CoV-2/FLU/RSV  testing. Fact Sheet for Patients: https://www.moore.com/ Fact Sheet for Healthcare Providers: https://www.young.biz/ This test is not yet approved or cleared by the [place] FDA and  has been authorized for detection and/or diagnosis of SARS-CoV-2 by  FDA under an Emergency Use Authorization (EUA). This EUA will remain  in effect (meaning this test can be used) for the duration of the  Covid-19 declaration under Section 564(b)(1) of the Act, 21  U.S.C. section 360bbb-3(b)(1), unless the authorization is  terminated or revoked. Performed at [facility], [address], [place], [place] [address]      Labs: Basic Metabolic Panel: Recent Labs  Lab 06/30/19 1205 06/30/19 1205 07/01/19 0947 07/02/19 0457  NA  138  --  140 137  K 4.4   < > 3.7 3.7  CL 107  --  103 103  CO2 25  --  27 26  GLUCOSE 98  --  129* 91  BUN 23*  --  21* 28*  CREATININE 2.23*  --  2.15* 2.34*  CALCIUM 8.6*  --  9.3 8.6*  MG  --   --  1.9 2.1   < > = values in this interval not displayed.   Liver Function Tests: No results for input(s): AST, ALT, ALKPHOS, BILITOT, PROT, ALBUMIN in the last 168 hours. No results for input(s): LIPASE, AMYLASE in the last 168 hours. No results for input(s): AMMONIA in the last 168 hours. CBC: Recent Labs  Lab 06/30/19 1205 07/02/19 0457  WBC 7.2 6.9  NEUTROABS 4.2  --   HGB 12.1 13.5  HCT 41.3 44.4  MCV 82.4 79.9*  PLT 230 257   Cardiac Enzymes: No results for input(s): CKTOTAL, CKMB, CKMBINDEX, TROPONINI in the last 168 hours. BNP: Invalid input(s): POCBNP CBG: No results for input(s): GLUCAP in the last 168 hours.  Time coordinating discharge:  36 minutes  Signed:  Catarina Hartshornavid Chinedum Vanhouten, DO [facility] Pager: [phone] 07/02/2019, 11:20 AM

## 2019-07-02 LAB — CBC
HCT: 44.4 % (ref 36.0–46.0)
Hemoglobin: 13.5 g/dL (ref 12.0–15.0)
MCH: 24.3 pg — ABNORMAL LOW (ref 26.0–34.0)
MCHC: 30.4 g/dL (ref 30.0–36.0)
MCV: 79.9 fL — ABNORMAL LOW (ref 80.0–100.0)
Platelets: 257 10*3/uL (ref 150–400)
RBC: 5.56 MIL/uL — ABNORMAL HIGH (ref 3.87–5.11)
RDW: 17.9 % — ABNORMAL HIGH (ref 11.5–15.5)
WBC: 6.9 10*3/uL (ref 4.0–10.5)
nRBC: 0 % (ref 0.0–0.2)

## 2019-07-02 LAB — BASIC METABOLIC PANEL
Anion gap: 8 (ref 5–15)
BUN: 28 mg/dL — ABNORMAL HIGH (ref 6–20)
CO2: 26 mmol/L (ref 22–32)
Calcium: 8.6 mg/dL — ABNORMAL LOW (ref 8.9–10.3)
Chloride: 103 mmol/L (ref 98–111)
Creatinine, Ser: 2.34 mg/dL — ABNORMAL HIGH (ref 0.44–1.00)
GFR calc Af Amer: 30 mL/min — ABNORMAL LOW (ref >60)
GFR calc non Af Amer: 26 mL/min — ABNORMAL LOW (ref >60)
Glucose, Bld: 91 mg/dL (ref 70–99)
Potassium: 3.7 mmol/L (ref 3.5–5.1)
Sodium: 137 mmol/L (ref 135–145)

## 2019-07-02 LAB — MAGNESIUM: Magnesium: 2.1 mg/dL (ref 1.7–2.4)

## 2019-07-02 LAB — BRAIN NATRIURETIC PEPTIDE: B Natriuretic Peptide: 335 pg/mL — ABNORMAL HIGH (ref 0.0–100.0)

## 2019-07-02 MED ORDER — CARVEDILOL 12.5 MG PO TABS: 12.5000 mg | ORAL_TABLET | Freq: Two times a day (BID) | ORAL | Status: DC

## 2019-07-02 MED ORDER — CARVEDILOL 12.5 MG PO TABS: 12.5000 mg | ORAL_TABLET | Freq: Two times a day (BID) | ORAL | 1 refills | Status: DC

## 2019-07-02 MED ORDER — FUROSEMIDE 20 MG PO TABS: 20.0000 mg | ORAL_TABLET | Freq: Every day | ORAL | 1 refills | Status: DC

## 2019-07-02 MED ORDER — CEFDINIR 300 MG PO CAPS: 300.0000 mg | ORAL_CAPSULE | Freq: Two times a day (BID) | ORAL | 0 refills | Status: DC

## 2019-07-02 MED ORDER — AZITHROMYCIN 500 MG PO TABS: 500.0000 mg | ORAL_TABLET | Freq: Every day | ORAL | 0 refills | Status: DC

## 2019-07-02 MED ORDER — FUROSEMIDE 40 MG PO TABS: 40.0000 mg | ORAL_TABLET | Freq: Every day | ORAL | Status: DC

## 2019-07-02 MED ORDER — FUROSEMIDE 20 MG PO TABS: 20.0000 mg | ORAL_TABLET | Freq: Every day | ORAL | Status: DC

## 2019-07-02 NOTE — Progress Notes (Signed)
MD notified of BP 168/130. PRN medication order for SB >180 not met. Reassessed BP 147/116. No additional ordered provided by MD

## 2019-07-02 NOTE — Progress Notes (Signed)
IV removed, discharge instructions reviewed.

## 2019-07-02 NOTE — Clinical Social Work Note (Signed)
Patient declined SA resources, indicating that she did not need them as SA was not an issue for her. Patient agreeable to being set up with PCP services. Patient scheduled with [facility] Department for 07/26/19 @ 10:00 a.m. Appointment provided to patient who indicated that she put the appointment in her phone.    Juletta Berhe, Juleen China, LCSW

## 2019-08-12 ENCOUNTER — Other Ambulatory Visit: Payer: Self-pay

## 2019-08-12 ENCOUNTER — Encounter (HOSPITAL_COMMUNITY): Payer: Self-pay | Admitting: Emergency Medicine

## 2019-08-12 ENCOUNTER — Emergency Department (HOSPITAL_COMMUNITY): Payer: Medicaid Other

## 2019-08-12 ENCOUNTER — Emergency Department (HOSPITAL_COMMUNITY)
Admission: EM | Admit: 2019-08-12 | Discharge: 2019-08-12 | Disposition: A | Discharge status: Home / Self Care | Payer: Medicaid Other | Attending: Emergency Medicine | Admitting: Emergency Medicine

## 2019-08-12 DIAGNOSIS — I509 Heart failure, unspecified: Secondary | ICD-10-CM | POA: Insufficient documentation

## 2019-08-12 DIAGNOSIS — F17210 Nicotine dependence, cigarettes, uncomplicated: Secondary | ICD-10-CM | POA: Diagnosis not present

## 2019-08-12 DIAGNOSIS — R05 Cough: Secondary | ICD-10-CM | POA: Diagnosis present

## 2019-08-12 DIAGNOSIS — J189 Pneumonia, unspecified organism: Secondary | ICD-10-CM | POA: Diagnosis not present

## 2019-08-12 DIAGNOSIS — Z20822 Contact with and (suspected) exposure to covid-19: Secondary | ICD-10-CM | POA: Diagnosis not present

## 2019-08-12 DIAGNOSIS — Z79899 Other long term (current) drug therapy: Secondary | ICD-10-CM | POA: Diagnosis not present

## 2019-08-12 DIAGNOSIS — I110 Hypertensive heart disease with heart failure: Secondary | ICD-10-CM | POA: Insufficient documentation

## 2019-08-12 DIAGNOSIS — R0602 Shortness of breath: Secondary | ICD-10-CM | POA: Diagnosis not present

## 2019-08-12 DIAGNOSIS — R059 Cough, unspecified: Secondary | ICD-10-CM

## 2019-08-12 HISTORY — DX: Heart failure, unspecified: I50.9

## 2019-08-12 LAB — BASIC METABOLIC PANEL
Anion gap: 7 (ref 5–15)
BUN: 24 mg/dL — ABNORMAL HIGH (ref 6–20)
CO2: 28 mmol/L (ref 22–32)
Calcium: 8.6 mg/dL — ABNORMAL LOW (ref 8.9–10.3)
Chloride: 103 mmol/L (ref 98–111)
Creatinine, Ser: 1.98 mg/dL — ABNORMAL HIGH (ref 0.44–1.00)
GFR calc Af Amer: 37 mL/min — ABNORMAL LOW (ref >60)
GFR calc non Af Amer: 32 mL/min — ABNORMAL LOW (ref >60)
Glucose, Bld: 108 mg/dL — ABNORMAL HIGH (ref 70–99)
Potassium: 4 mmol/L (ref 3.5–5.1)
Sodium: 138 mmol/L (ref 135–145)

## 2019-08-12 LAB — CBC
HCT: 43.3 % (ref 36.0–46.0)
Hemoglobin: 12.7 g/dL (ref 12.0–15.0)
MCH: 23.7 pg — ABNORMAL LOW (ref 26.0–34.0)
MCHC: 29.3 g/dL — ABNORMAL LOW (ref 30.0–36.0)
MCV: 80.8 fL (ref 80.0–100.0)
Platelets: 235 10*3/uL (ref 150–400)
RBC: 5.36 MIL/uL — ABNORMAL HIGH (ref 3.87–5.11)
RDW: 18.1 % — ABNORMAL HIGH (ref 11.5–15.5)
WBC: 7.2 10*3/uL (ref 4.0–10.5)
nRBC: 0 % (ref 0.0–0.2)

## 2019-08-12 LAB — POC SARS CORONAVIRUS 2 AG -  ED: SARS Coronavirus 2 Ag: NEGATIVE

## 2019-08-12 MED ORDER — DOXYCYCLINE HYCLATE 100 MG PO CAPS: 100.0000 mg | ORAL_CAPSULE | Freq: Two times a day (BID) | ORAL | 0 refills | Status: DC

## 2019-08-12 MED ORDER — ALBUTEROL SULFATE HFA 108 (90 BASE) MCG/ACT IN AERS: 2.0000 | INHALATION_SPRAY | RESPIRATORY_TRACT | 0 refills | Status: AC | PRN

## 2019-08-12 NOTE — ED Provider Notes (Signed)
[facility] EMERGENCY DEPARTMENT Provider Note   CSN: [id number] Arrival date & time: 08/12/19  0945     History Chief Complaint  Patient presents with  . Cough    LOIS SLAGEL is a 34 y.o. female with pertinent past medical history of CHF, sleep apnea, hypertension, tobacco abuse that presents to the emergency department today for shortness of breath and cough.  Patient states that this started 4 days ago, states that cough is productive with sputum.  States that she is only had one episode of productive sputum yesterday.  Admits to orthopnea.  No hemoptysis.  Patient states that she normally has shortness of breath since her new diagnosis of CHF, however her shortness of breath has been getting worse over the past 4 days.  Denies any sick contacts.  Denies any fevers, chills, chest pain, nausea, vomiting, abdominal pain.  Patient states that her legs are normally swollen and takes Lasix at home for her CHF, however not swollen today. Of note, patient was discharged on 3/29 for uncontrolled hypertension and new diagnosis CHF.  She was also discharged with azithromycin and cefdinir due to potential pneumonia.  Patient states that her shortness of breath and symptoms had fully resolved after leaving the hospital. Patient states that her blood pressure has been controlled at home, she is taking the prescribed medications hydralazine and carvedilol as prescribed.  She admits to smoking cigarettes.  Patient follows up with health department for her blood pressure control.  Patient denies headache, vision changes, gait abnormalities, back pain, weakness. Patient is compliant with her blood pressure medications. Pt was able to walk to her room.   HPI     Past Medical History:  Diagnosis Date  . CHF (congestive heart failure) ([place])   . Hypertension   . Sleep apnea     Patient Active Problem List   Diagnosis Date Noted  . Acute systolic CHF (congestive heart failure) ([place]) 07/01/2019  .  Cocaine abuse ([place]) 07/01/2019  . Acute CHF (congestive heart failure) ([place]) 06/30/2019  . Hypertensive urgency 06/30/2019  . Acute renal failure superimposed on stage 3a chronic kidney disease ([place]) 06/30/2019  . Morbid obesity with BMI of 50.0-59.9, adult ([place]) 06/30/2019  . Tobacco abuse 06/30/2019    History reviewed. No pertinent surgical history.   OB History    Gravida      Para      Term      Preterm      AB      Living  0     SAB      TAB      Ectopic      Multiple      Live Births              Family History  Problem Relation Age of Onset  . Cancer Mother   . Heart failure Father   . Diabetes Other     Social History   Tobacco Use  . Smoking status: Current Every Day Smoker    Packs/day: 0.30    Years: 7.00    Pack years: 2.10    Types: Cigarettes  . Smokeless tobacco: Never Used  Substance Use Topics  . Alcohol use: Yes    Comment: Occasionally  . Drug use: No    Home Medications Prior to Admission medications   Medication Sig Start Date End Date Taking? Authorizing Provider  carvedilol (COREG) 12.5 MG tablet Take 1 tablet (12.5 mg total) by mouth 2 (two) times  daily with a meal. Patient taking differently: Take 25 mg by mouth 2 (two) times daily with a meal.  07/02/19  Yes Tat, David, MD  furosemide (LASIX) 20 MG tablet Take 1 tablet (20 mg total) by mouth daily. 07/03/19  Yes Tat, Onalee Hua, MD  hydrALAZINE (APRESOLINE) 25 MG tablet Take 25 mg by mouth 3 (three) times daily. 08/09/19  Yes [provider]  hydrOXYzine (VISTARIL) 25 MG capsule Take 25-50 mg by mouth every 6 (six) hours as needed. 08/09/19  Yes [provider]  albuterol (VENTOLIN HFA) 108 (90 Base) MCG/ACT inhaler Inhale 2 puffs into the lungs every 4 (four) hours as needed for wheezing or shortness of breath (as needed for shortness of breath). 08/12/19   Farrel Gordon, PA-C  azithromycin (ZITHROMAX) 500 MG tablet Take 1 tablet (500 mg total) by mouth  daily. Patient not taking: Reported on 08/12/2019 07/03/19   Catarina Hartshorn, MD  cefdinir (OMNICEF) 300 MG capsule Take 1 capsule (300 mg total) by mouth 2 (two) times daily. Patient not taking: Reported on 08/12/2019 07/02/19   Catarina Hartshorn, MD  doxycycline (VIBRAMYCIN) 100 MG capsule Take 1 capsule (100 mg total) by mouth 2 (two) times daily. 08/12/19   Farrel Gordon, PA-C  traMADol (ULTRAM) 50 MG tablet Take 1 tablet (50 mg total) by mouth every 6 (six) hours as needed. Patient not taking: Reported on 08/12/2019 03/08/16   Pauline Aus, PA-C    Allergies    Codeine, Hydrocodone-acetaminophen, Penicillins, and Tylenol [acetaminophen]  Review of Systems   Review of Systems  Constitutional: Negative for chills, diaphoresis, fatigue and fever.  HENT: Negative for congestion, sore throat and trouble swallowing.   Eyes: Negative for pain and visual disturbance.  Respiratory: Positive for cough and shortness of breath. Negative for wheezing.   Cardiovascular: Negative for chest pain, palpitations and leg swelling.  Gastrointestinal: Negative for abdominal distention, abdominal pain, diarrhea, nausea and vomiting.  Genitourinary: Negative for difficulty urinating.  Musculoskeletal: Negative for back pain, neck pain and neck stiffness.  Skin: Negative for pallor.  Neurological: Negative for dizziness, speech difficulty, weakness and headaches.  Psychiatric/Behavioral: Negative for confusion.    Physical Exam Updated Vital Signs BP (!) 176/109   Pulse 81   Temp 97.7 F (36.5 C) (Oral)   Resp (!) 32   Ht 5\' 6"  (1.676 m)   Wt (!) 164.2 kg   LMP 08/03/2019   SpO2 95%   BMI 58.43 kg/m   Physical Exam Constitutional:      General: She is not in acute distress.    Appearance: Normal appearance. She is obese. She is not ill-appearing, toxic-appearing or diaphoretic.  HENT:     Mouth/Throat:     Mouth: Mucous membranes are moist.     Pharynx: Oropharynx is clear.  Eyes:     General: No  scleral icterus.    Extraocular Movements: Extraocular movements intact.     Pupils: Pupils are equal, round, and reactive to light.  Cardiovascular:     Rate and Rhythm: Normal rate and regular rhythm.     Pulses: Normal pulses.     Heart sounds: Normal heart sounds.  Pulmonary:     Effort: Pulmonary effort is normal. No respiratory distress.     Breath sounds: Normal breath sounds. No stridor. No wheezing, rhonchi or rales.     Comments: Tachypneic to 21. Chest:     Chest wall: No tenderness.  Abdominal:     General: Abdomen is flat. There is no distension.  Palpations: Abdomen is soft.     Tenderness: There is no abdominal tenderness. There is no guarding or rebound.  Musculoskeletal:        General: No swelling or tenderness. Normal range of motion.     Cervical back: Normal range of motion and neck supple. No rigidity.     Right lower leg: No edema.     Left lower leg: No edema.  Skin:    General: Skin is warm and dry.     Capillary Refill: Capillary refill takes less than 2 seconds.     Coloration: Skin is not pale.  Neurological:     General: No focal deficit present.     Mental Status: She is alert and oriented to person, place, and time.     Cranial Nerves: No cranial nerve deficit.     Sensory: No sensory deficit.     Motor: No weakness.     Coordination: Coordination normal.  Psychiatric:        Mood and Affect: Mood normal.        Behavior: Behavior normal.        Thought Content: Thought content normal.     ED Results / Procedures / Treatments   Labs (all labs ordered are listed, but only abnormal results are displayed) Labs Reviewed  BASIC METABOLIC PANEL - Abnormal; Notable for the following components:      Result Value   Glucose, Bld 108 (*)    BUN 24 (*)    Creatinine, Ser 1.98 (*)    Calcium 8.6 (*)    GFR calc non Af Amer 32 (*)    GFR calc Af Amer 37 (*)    All other components within normal limits  CBC - Abnormal; Notable for the following  components:   RBC 5.36 (*)    MCH 23.7 (*)    MCHC 29.3 (*)    RDW 18.1 (*)    All other components within normal limits  SARS CORONAVIRUS 2 (TAT 6-24 HRS)  POC SARS CORONAVIRUS 2 AG -  ED    EKG EKG Interpretation  Date/Time:  Monday Aug 12 2019 10:16:50 EDT Ventricular Rate:  67 PR Interval:  180 QRS Duration: 106 QT Interval:  462 QTC Calculation: 488 R Axis:   -14 Text Interpretation: Normal sinus rhythm Left ventricular hypertrophy with repolarization abnormality ( R in aVL , Cornell product ) Prolonged QT Non-specific ST-t changes Confirmed by Cathren Laine ([id number]) on 08/12/2019 10:26:57 AM   Radiology DG Chest 2 View  Result Date: 08/12/2019 CLINICAL DATA:  Cough and shortness of breath. Lower extremity swelling. EXAM: CHEST - 2 VIEW COMPARISON:  Chest x-ray and CT chest dated June 30, 2019. FINDINGS: Unchanged cardiomegaly. Normal pulmonary vascularity. Low lung volumes are present, causing crowding of the pulmonary vasculature. Consolidation in the left lower lobe. Mild right basilar atelectasis. No pleural effusion or pneumothorax. No acute osseous abnormality. IMPRESSION: 1. Left lower lobe pneumonia. Electronically Signed   By: Obie Dredge M.D.   On: 08/12/2019 10:37    Procedures Procedures (including critical care time)  Medications Ordered in ED Medications - No data to display  ED Course  I have reviewed the triage vital signs and the nursing notes.  Pertinent labs & imaging results that were available during my care of the patient were reviewed by me and considered in my medical decision making (see chart for details).    MDM Rules/Calculators/A&P  Careli Luzader Koral is a 34 y.o. female with pertinent past medical history of CHF, sleep apnea, hypertension, tobacco abuse that presents to the emergency department today for shortness of breath and cough..The causes for shortness of breath include but are not limited to Cardiac (AHF,  pericardial effusion and tamponade, arrhythmias, ischemia, etc) Respiratory (COPD, asthma, pneumonia, pneumothorax, primary pulmonary hypertension, PE/VQ mismatch) Hematological (anemia) Neuromuscular (ALS, Guillain-Barr, etc  ECG interpreted by me demonstrated nomral sinus.  CXR interpreted by me demonstrated LLL PNA.  Labs demonstrated normal chemistries, creatinine is 1.98 patient's baseline is 2.5.  CBC with no leukocytosis or anemia.  Upon reassessment patient's blood pressure is still elevated, patient is still not complaining of any chest pain. Patient's respirations 20.  Not tachycardic.  Afebrile. BP 158/111. O2 sat 99. Is able to walk without  Any difficulty. POC Covid swab negative.  Given the above findings, my suspicion is that patient has pneumonia.  Patient is morbidly obese and states that she normally has tachypnea.  Expressed to patient that she has pneumonia and she needs to take antibiotics for this.  Patient expressed multiple times that she cannot afford any antibiotics that are not on the $4 list and will not get them unless they are.  Discussed this with Dr. Denton Lank who stated to prescribe her doxycycline.  Stated that this medication alone will be enough to do her normal vitals, no leukocytosis, clinical picture.  Patient is allergic to penicillins, states that should not get Cephlasporin at this time due to her rash involving mucous membranes since last medical interaction.  Patient did get cefdinir last time, however states that she can not afford this again.  Dr. Denton Lank said that doxycycline will be sufficient at this time.  The patient is safe and stable for discharge at this time with return precautions provided and a plan for follow up care in place as needed. Pt RR 20, pt states that this is her baseline. (Some documented at 37 which is when she is moving in bed, I was in the room multiple times and never saw it above 22). 02 sat always 99 percent while I was speaking  to her. Albuterol given for shortness of breath for home.  Patient to follow-up with her primary care soon as possible to the symptoms.  Expressed the importance to the patient that she needs to come back if she starts having worsening symptoms, worsening shortness of breath since she has only been covered with one antibiotic for her community-acquired pneumonia.  Patient expressed that she would and follow-up with her primary care.  The plan for this patient was discussed with Dr. Denton Lank. who voiced agreement and who oversaw evaluation and treatment of this patient.  Patient's blood pressure elevated in the emergency department today. Patient denies headache, change in vision, numbness, weakness, chest pain, dizziness, or lightheadedness therefore doubt hypertensive emergency.  Patient states that her blood pressure has been only more elevated at home and is constantly elevated.  Per chart review patient's blood pressure has been elevated consistently.  Discussed elevated blood pressure with the patient and the need for primary care follow up with potential need to initiate or change antihypertensive medications and or for further evaluation. Discussed return precaution signs/symptoms for hypertensive emergency as listed above with the patient. He/she confirmed understanding.  Patient states that her blood pressure is normally systolic of the 230s.  States that she is seeing someone for this.  Is compliant with her blood pressure medications. Referred pt to  Cardiologist in [place] to help better contConverserol BP.     Final Clinical Impression(s) / ED Diagnoses Final diagnoses:  Community acquired pneumonia of left lower lobe of lung  Cough    Rx / DC Orders ED Discharge Orders         Ordered    albuterol (VENTOLIN HFA) 108 (90 Base) MCG/ACT inhaler  Every 4 hours PRN     08/12/19 1248    doxycycline (VIBRAMYCIN) 100 MG capsule  2 times daily     08/12/19 [id number][id number]           Kristion Holifield,  PA-C 08/14/19 [id number]    Cathren LaineSteinl, Kevin, MD 08/15/19 1144

## 2019-08-12 NOTE — ED Notes (Signed)
Pt called and talked with AC. Pt reported needed a cheaper antibiotic and could not afford doxycycline prescription.   Called pt pharmacy and discussed with pharmacist and recommended azithromycin z-pac. Discussed recommendations with EDP and verbalized agreement.   Pt pharmacy contacted and spoke with pharmacist and new prescription being filled.   Attempted to call pt x2 and notify of px change, pt voice mailbox full and unable to leave message.

## 2019-08-12 NOTE — ED Triage Notes (Signed)
Pt reports productive cough, shortness of breath, generalized weakness, BLE swelling, hx of "fluid around lungs." and sleeping with c-pap at night.

## 2019-08-12 NOTE — Discharge Instructions (Addendum)
You were seen today for shortness of breath.  Your x-ray showed left lower lobe pneumonia.  Take the antibiotics fully as prescribed.  Eat these with a full meal.  Use the albuterol inhaler as we talked about when you feel more short of breath.  Please follow-up with your primary care soon as possible.  I also provided you with heart care in [place] for your blood pressure management.  Please keep take your medications as prescribed.  Come back to the emergency department if you  don't start feeling better in the next 48 hours, have chest pain, fevers, lightheadedness, worsening shortness of breath, vision changes, or other symptoms suggesting worsening symptoms.  .Your COVID test is pending; you should expect results in 2-3 days. You can access your results on your MyChart--if you test positive you should receive a phone call.  In the meantime follow CDC guidelines and quarantine, wear a mask, wash hands often.   Please take over the counter vitamin D 2000-4000 units per day. I also recommend zinc 50 mg per day for the next two weeks.   Please return to ED if you feel have difficulty breathing or have emergent, new or concerning symptoms.  Patients who have symptoms consistent with COVID-19 should self isolated for: At least 3 days (72 hours) have passed since recovery, defined as resolution of fever without the use of fever reducing medications and improvement in respiratory symptoms (e.g., cough, shortness of breath), and At least 7 days have passed since symptoms first appeared.       Person Under Monitoring Name: Samantha Mccarthy  Location: [address] [place] [address]   Infection Prevention Recommendations for Individuals Confirmed to have, or Being Evaluated for, 2019 Novel Coronavirus (COVID-19) Infection Who Receive Care at Home  Individuals who are confirmed to have, or are being evaluated for, COVID-19 should follow the prevention steps below until a healthcare  provider or local or state health department says they can return to normal activities.  Stay home except to get medical care You should restrict activities outside your home, except for getting medical care. Do not go to work, school, or public areas, and do not use public transportation or taxis.  Call ahead before visiting your doctor Before your medical appointment, call the healthcare provider and tell them that you have, or are being evaluated for, COVID-19 infection. This will help the healthcare provider's office take steps to keep other people from getting infected. Ask your healthcare provider to call the local or state health department.  Monitor your symptoms Seek prompt medical attention if your illness is worsening (e.g., difficulty breathing). Before going to your medical appointment, call the healthcare provider and tell them that you have, or are being evaluated for, COVID-19 infection. Ask your healthcare provider to call the local or state health department.  Wear a facemask You should wear a facemask that covers your nose and mouth when you are in the same room with other people and when you visit a healthcare provider. People who live with or visit you should also wear a facemask while they are in the same room with you.  Separate yourself from other people in your home As much as possible, you should stay in a different room from other people in your home. Also, you should use a separate bathroom, if available.  Avoid sharing household items You should not share dishes, drinking glasses, cups, eating utensils, towels, bedding, or other items with other people in  your home. After using these items, you should wash them thoroughly with soap and water.  Cover your coughs and sneezes Cover your mouth and nose with a tissue when you cough or sneeze, or you can cough or sneeze into your sleeve. Throw used tissues in a lined trash can, and immediately wash your hands  with soap and water for at least 20 seconds or use an alcohol-based hand rub.  Wash your [employer] your hands often and thoroughly with soap and water for at least 20 seconds. You can use an alcohol-based hand sanitizer if soap and water are not available and if your hands are not visibly dirty. Avoid touching your eyes, nose, and mouth with unwashed hands.   Prevention Steps for Caregivers and Household Members of Individuals Confirmed to have, or Being Evaluated for, COVID-19 Infection Being Cared for in the Home  If you live with, or provide care at home for, a person confirmed to have, or being evaluated for, COVID-19 infection please follow these guidelines to prevent infection:  Follow healthcare provider's instructions Make sure that you understand and can help the patient follow any healthcare provider instructions for all care.  Provide for the patient's basic needs You should help the patient with basic needs in the home and provide support for getting groceries, prescriptions, and other personal needs.  Monitor the patient's symptoms If they are getting sicker, call his or her medical provider and tell them that the patient has, or is being evaluated for, COVID-19 infection. This will help the healthcare provider's office take steps to keep other people from getting infected. Ask the healthcare provider to call the local or state health department.  Limit the number of people who have contact with the patient If possible, have only one caregiver for the patient. Other household members should stay in another home or place of residence. If this is not possible, they should stay in another room, or be separated from the patient as much as possible. Use a separate bathroom, if available. Restrict visitors who do not have an essential need to be in the home.  Keep older adults, very young children, and other sick people away from the patient Keep older adults, very young  children, and those who have compromised immune systems or chronic health conditions away from the patient. This includes people with chronic heart, lung, or kidney conditions, diabetes, and cancer.  Ensure good ventilation Make sure that shared spaces in the home have good air flow, such as from an air conditioner or an opened window, weather permitting.  Wash your hands often Wash your hands often and thoroughly with soap and water for at least 20 seconds. You can use an alcohol based hand sanitizer if soap and water are not available and if your hands are not visibly dirty. Avoid touching your eyes, nose, and mouth with unwashed hands. Use disposable paper towels to dry your hands. If not available, use dedicated cloth towels and replace them when they become wet.  Wear a facemask and gloves Wear a disposable facemask at all times in the room and gloves when you touch or have contact with the patient's blood, body fluids, and/or secretions or excretions, such as sweat, saliva, sputum, nasal mucus, vomit, urine, or feces.  Ensure the mask fits over your nose and mouth tightly, and do not touch it during use. Throw out disposable facemasks and gloves after using them. Do not reuse. Wash your hands immediately after removing your facemask and gloves.  If your personal clothing becomes contaminated, carefully remove clothing and launder. Wash your hands after handling contaminated clothing. Place all used disposable facemasks, gloves, and other waste in a lined container before disposing them with other household waste. Remove gloves and wash your hands immediately after handling these items.  Do not share dishes, glasses, or other household items with the patient Avoid sharing household items. You should not share dishes, drinking glasses, cups, eating utensils, towels, bedding, or other items with a patient who is confirmed to have, or being evaluated for, COVID-19 infection. After the person  uses these items, you should wash them thoroughly with soap and water.  Wash laundry thoroughly Immediately remove and wash clothes or bedding that have blood, body fluids, and/or secretions or excretions, such as sweat, saliva, sputum, nasal mucus, vomit, urine, or feces, on them. Wear gloves when handling laundry from the patient. Read and follow directions on labels of laundry or clothing items and detergent. In general, wash and dry with the warmest temperatures recommended on the label.  Clean all areas the individual has used often Clean all touchable surfaces, such as counters, tabletops, doorknobs, bathroom fixtures, toilets, phones, keyboards, tablets, and bedside tables, every day. Also, clean any surfaces that may have blood, body fluids, and/or secretions or excretions on them. Wear gloves when cleaning surfaces the patient has come in contact with. Use a diluted bleach solution (e.g., dilute bleach with 1 part bleach and 10 parts water) or a household disinfectant with a label that says EPA-registered for coronaviruses. To make a bleach solution at home, add 1 tablespoon of bleach to 1 quart (4 cups) of water. For a larger supply, add  cup of bleach to 1 gallon (16 cups) of water. Read labels of cleaning products and follow recommendations provided on product labels. Labels contain instructions for safe and effective use of the cleaning product including precautions you should take when applying the product, such as wearing gloves or eye protection and making sure you have good ventilation during use of the product. Remove gloves and wash hands immediately after cleaning.  Monitor yourself for signs and symptoms of illness Caregivers and household members are considered close contacts, should monitor their health, and will be asked to limit movement outside of the home to the extent possible. Follow the monitoring steps for close contacts listed on the symptom monitoring form.   ?  If you have additional questions, contact your local health department or call the epidemiologist on call at [phone] (available 24/7). ? This guidance is subject to change. For the most up-to-date guidance from [facility], please refer to their website: [email or web address][email or web address]

## 2019-08-13 LAB — SARS CORONAVIRUS 2 (TAT 6-24 HRS): SARS Coronavirus 2: NEGATIVE

## 2019-09-03 ENCOUNTER — Emergency Department (HOSPITAL_COMMUNITY): Payer: Medicaid Other

## 2019-09-03 ENCOUNTER — Other Ambulatory Visit: Payer: Self-pay

## 2019-09-03 ENCOUNTER — Inpatient Hospital Stay (HOSPITAL_COMMUNITY): Payer: Medicaid Other

## 2019-09-03 ENCOUNTER — Encounter (HOSPITAL_COMMUNITY): Payer: Self-pay | Admitting: Emergency Medicine

## 2019-09-03 ENCOUNTER — Inpatient Hospital Stay (HOSPITAL_COMMUNITY)
Admission: EM | Admit: 2019-09-03 | Discharge: 2019-09-06 | DRG: 291 | Disposition: A | Discharge status: Home / Self Care | Payer: Medicaid Other | Attending: Internal Medicine | Admitting: Internal Medicine

## 2019-09-03 DIAGNOSIS — R519 Headache, unspecified: Secondary | ICD-10-CM | POA: Diagnosis present

## 2019-09-03 DIAGNOSIS — J811 Chronic pulmonary edema: Secondary | ICD-10-CM | POA: Diagnosis present

## 2019-09-03 DIAGNOSIS — F17210 Nicotine dependence, cigarettes, uncomplicated: Secondary | ICD-10-CM | POA: Diagnosis present

## 2019-09-03 DIAGNOSIS — Z20822 Contact with and (suspected) exposure to covid-19: Secondary | ICD-10-CM | POA: Diagnosis present

## 2019-09-03 DIAGNOSIS — Z8249 Family history of ischemic heart disease and other diseases of the circulatory system: Secondary | ICD-10-CM | POA: Diagnosis not present

## 2019-09-03 DIAGNOSIS — Z885 Allergy status to narcotic agent status: Secondary | ICD-10-CM | POA: Diagnosis not present

## 2019-09-03 DIAGNOSIS — J9601 Acute respiratory failure with hypoxia: Secondary | ICD-10-CM | POA: Diagnosis present

## 2019-09-03 DIAGNOSIS — I130 Hypertensive heart and chronic kidney disease with heart failure and stage 1 through stage 4 chronic kidney disease, or unspecified chronic kidney disease: Secondary | ICD-10-CM | POA: Diagnosis present

## 2019-09-03 DIAGNOSIS — Z6841 Body Mass Index (BMI) 40.0 and over, adult: Secondary | ICD-10-CM | POA: Diagnosis not present

## 2019-09-03 DIAGNOSIS — Z9119 Patient's noncompliance with other medical treatment and regimen: Secondary | ICD-10-CM | POA: Diagnosis not present

## 2019-09-03 DIAGNOSIS — Z880 Allergy status to penicillin: Secondary | ICD-10-CM

## 2019-09-03 DIAGNOSIS — N1831 Chronic kidney disease, stage 3a: Secondary | ICD-10-CM | POA: Diagnosis present

## 2019-09-03 DIAGNOSIS — G4733 Obstructive sleep apnea (adult) (pediatric): Secondary | ICD-10-CM | POA: Diagnosis present

## 2019-09-03 DIAGNOSIS — Z720 Tobacco use: Secondary | ICD-10-CM | POA: Diagnosis present

## 2019-09-03 DIAGNOSIS — I509 Heart failure, unspecified: Secondary | ICD-10-CM

## 2019-09-03 DIAGNOSIS — Z888 Allergy status to other drugs, medicaments and biological substances status: Secondary | ICD-10-CM | POA: Diagnosis not present

## 2019-09-03 DIAGNOSIS — E8770 Fluid overload, unspecified: Secondary | ICD-10-CM | POA: Diagnosis present

## 2019-09-03 DIAGNOSIS — I161 Hypertensive emergency: Secondary | ICD-10-CM | POA: Diagnosis present

## 2019-09-03 DIAGNOSIS — I5023 Acute on chronic systolic (congestive) heart failure: Secondary | ICD-10-CM

## 2019-09-03 DIAGNOSIS — F1410 Cocaine abuse, uncomplicated: Secondary | ICD-10-CM | POA: Diagnosis present

## 2019-09-03 DIAGNOSIS — Z79899 Other long term (current) drug therapy: Secondary | ICD-10-CM | POA: Diagnosis not present

## 2019-09-03 DIAGNOSIS — J810 Acute pulmonary edema: Secondary | ICD-10-CM

## 2019-09-03 DIAGNOSIS — G4730 Sleep apnea, unspecified: Secondary | ICD-10-CM | POA: Diagnosis present

## 2019-09-03 DIAGNOSIS — I10 Essential (primary) hypertension: Secondary | ICD-10-CM | POA: Diagnosis present

## 2019-09-03 LAB — BASIC METABOLIC PANEL
Anion gap: 10 (ref 5–15)
BUN: 25 mg/dL — ABNORMAL HIGH (ref 6–20)
CO2: 26 mmol/L (ref 22–32)
Calcium: 8.3 mg/dL — ABNORMAL LOW (ref 8.9–10.3)
Chloride: 104 mmol/L (ref 98–111)
Creatinine, Ser: 1.95 mg/dL — ABNORMAL HIGH (ref 0.44–1.00)
GFR calc Af Amer: 38 mL/min — ABNORMAL LOW (ref >60)
GFR calc non Af Amer: 33 mL/min — ABNORMAL LOW (ref >60)
Glucose, Bld: 93 mg/dL (ref 70–99)
Potassium: 4 mmol/L (ref 3.5–5.1)
Sodium: 140 mmol/L (ref 135–145)

## 2019-09-03 LAB — CBC WITH DIFFERENTIAL/PLATELET
Abs Immature Granulocytes: 0.02 10*3/uL (ref 0.00–0.07)
Basophils Absolute: 0 10*3/uL (ref 0.0–0.1)
Basophils Relative: 0 %
Eosinophils Absolute: 0.2 10*3/uL (ref 0.0–0.5)
Eosinophils Relative: 3 %
HCT: 39.8 % (ref 36.0–46.0)
Hemoglobin: 11.7 g/dL — ABNORMAL LOW (ref 12.0–15.0)
Immature Granulocytes: 0 %
Lymphocytes Relative: 30 %
Lymphs Abs: 2.1 10*3/uL (ref 0.7–4.0)
MCH: 23.7 pg — ABNORMAL LOW (ref 26.0–34.0)
MCHC: 29.4 g/dL — ABNORMAL LOW (ref 30.0–36.0)
MCV: 80.6 fL (ref 80.0–100.0)
Monocytes Absolute: 0.7 10*3/uL (ref 0.1–1.0)
Monocytes Relative: 10 %
Neutro Abs: 4 10*3/uL (ref 1.7–7.7)
Neutrophils Relative %: 57 %
Platelets: 256 10*3/uL (ref 150–400)
RBC: 4.94 MIL/uL (ref 3.87–5.11)
RDW: 18.1 % — ABNORMAL HIGH (ref 11.5–15.5)
WBC: 7.1 10*3/uL (ref 4.0–10.5)
nRBC: 0 % (ref 0.0–0.2)

## 2019-09-03 LAB — RAPID URINE DRUG SCREEN, HOSP PERFORMED
Amphetamines: NOT DETECTED
Barbiturates: NOT DETECTED
Benzodiazepines: NOT DETECTED
Cocaine: NOT DETECTED
Opiates: NOT DETECTED
Tetrahydrocannabinol: NOT DETECTED

## 2019-09-03 LAB — BRAIN NATRIURETIC PEPTIDE: B Natriuretic Peptide: 1486 pg/mL — ABNORMAL HIGH (ref 0.0–100.0)

## 2019-09-03 LAB — TROPONIN I (HIGH SENSITIVITY)
Troponin I (High Sensitivity): 35 ng/L — ABNORMAL HIGH (ref <18)
Troponin I (High Sensitivity): 30 ng/L — ABNORMAL HIGH (ref <18)

## 2019-09-03 LAB — SARS CORONAVIRUS 2 BY RT PCR (HOSPITAL ORDER, PERFORMED IN ~~LOC~~ HOSPITAL LAB): SARS Coronavirus 2: NEGATIVE

## 2019-09-03 LAB — HEMOGLOBIN A1C
Hgb A1c MFr Bld: 6.3 % — ABNORMAL HIGH (ref 4.8–5.6)
Mean Plasma Glucose: 134.11 mg/dL

## 2019-09-03 LAB — HCG, SERUM, QUALITATIVE: Preg, Serum: NEGATIVE

## 2019-09-03 MED ORDER — CHLORHEXIDINE GLUCONATE CLOTH 2 % EX PADS
6.0000 | MEDICATED_PAD | Freq: Every day | CUTANEOUS | Status: DC
  Administered 2019-09-04 – 2019-09-06 (×3): 6 via TOPICAL

## 2019-09-03 MED ORDER — FUROSEMIDE 10 MG/ML IJ SOLN
40.0000 mg | Freq: Once | INTRAMUSCULAR | Status: AC
  Administered 2019-09-03: 40 mg via INTRAVENOUS
  Filled 2019-09-03: qty 4

## 2019-09-03 MED ORDER — ZOLPIDEM TARTRATE 5 MG PO TABS
5.0000 mg | ORAL_TABLET | Freq: Every evening | ORAL | Status: DC | PRN
  Administered 2019-09-03 – 2019-09-04 (×2): 5 mg via ORAL
  Filled 2019-09-03 (×3): qty 1

## 2019-09-03 MED ORDER — ALBUTEROL SULFATE (2.5 MG/3ML) 0.083% IN NEBU
5.0000 mg | INHALATION_SOLUTION | Freq: Once | RESPIRATORY_TRACT | Status: AC
  Administered 2019-09-03: 5 mg via RESPIRATORY_TRACT
  Filled 2019-09-03: qty 6

## 2019-09-03 MED ORDER — NICARDIPINE HCL IN NACL 20-0.86 MG/200ML-% IV SOLN
3.0000 mg/h | INTRAVENOUS | Status: DC
  Administered 2019-09-03: 5 mg/h via INTRAVENOUS
  Administered 2019-09-04 (×3): 10 mg/h via INTRAVENOUS
  Administered 2019-09-04: 5 mg/h via INTRAVENOUS
  Administered 2019-09-04: 12.5 mg/h via INTRAVENOUS
  Administered 2019-09-04: 5 mg/h via INTRAVENOUS
  Administered 2019-09-04: 7.5 mg/h via INTRAVENOUS
  Administered 2019-09-04 – 2019-09-05 (×2): 5 mg/h via INTRAVENOUS
  Filled 2019-09-03 (×6): qty 200
  Filled 2019-09-03: qty 400
  Filled 2019-09-03 (×2): qty 200

## 2019-09-03 MED ORDER — CARVEDILOL 12.5 MG PO TABS: 12.5000 mg | ORAL_TABLET | Freq: Two times a day (BID) | ORAL | Status: DC

## 2019-09-03 MED ORDER — ONDANSETRON HCL 4 MG/2ML IJ SOLN
4.0000 mg | Freq: Four times a day (QID) | INTRAMUSCULAR | Status: DC | PRN
  Administered 2019-09-03: 4 mg via INTRAVENOUS
  Filled 2019-09-03: qty 2

## 2019-09-03 MED ORDER — IPRATROPIUM-ALBUTEROL 0.5-2.5 (3) MG/3ML IN SOLN
3.0000 mL | Freq: Four times a day (QID) | RESPIRATORY_TRACT | Status: DC
  Administered 2019-09-03 (×2): 3 mL via RESPIRATORY_TRACT
  Filled 2019-09-03 (×2): qty 3

## 2019-09-03 MED ORDER — SODIUM CHLORIDE 0.9% FLUSH
3.0000 mL | Freq: Two times a day (BID) | INTRAVENOUS | Status: DC
  Administered 2019-09-03 – 2019-09-05 (×6): 3 mL via INTRAVENOUS

## 2019-09-03 MED ORDER — CARVEDILOL 12.5 MG PO TABS
12.5000 mg | ORAL_TABLET | Freq: Two times a day (BID) | ORAL | Status: DC
  Administered 2019-09-03: 12.5 mg via ORAL
  Filled 2019-09-03: qty 1

## 2019-09-03 MED ORDER — ENOXAPARIN SODIUM 80 MG/0.8ML ~~LOC~~ SOLN
80.0000 mg | SUBCUTANEOUS | Status: DC
  Administered 2019-09-03 – 2019-09-05 (×3): 80 mg via SUBCUTANEOUS
  Filled 2019-09-03 (×3): qty 0.8

## 2019-09-03 MED ORDER — FUROSEMIDE 10 MG/ML IJ SOLN
40.0000 mg | Freq: Two times a day (BID) | INTRAMUSCULAR | Status: AC
  Administered 2019-09-03 – 2019-09-05 (×5): 40 mg via INTRAVENOUS
  Filled 2019-09-03 (×5): qty 4

## 2019-09-03 MED ORDER — CLEVIDIPINE BUTYRATE 0.5 MG/ML IV EMUL
0.0000 mg/h | INTRAVENOUS | Status: DC
  Administered 2019-09-03: 1 mg/h via INTRAVENOUS
  Filled 2019-09-03 (×2): qty 50

## 2019-09-03 MED ORDER — IPRATROPIUM-ALBUTEROL 0.5-2.5 (3) MG/3ML IN SOLN
3.0000 mL | Freq: Three times a day (TID) | RESPIRATORY_TRACT | Status: DC
  Administered 2019-09-04 (×2): 3 mL via RESPIRATORY_TRACT
  Filled 2019-09-03 (×2): qty 3

## 2019-09-03 MED ORDER — NITROGLYCERIN IN D5W 200-5 MCG/ML-% IV SOLN
5.0000 ug/min | INTRAVENOUS | Status: DC
  Administered 2019-09-03: 10 ug/min via INTRAVENOUS
  Filled 2019-09-03: qty 250

## 2019-09-03 MED ORDER — HYDROXYZINE HCL 25 MG PO TABS
25.0000 mg | ORAL_TABLET | Freq: Four times a day (QID) | ORAL | Status: DC | PRN
  Administered 2019-09-03: 25 mg via ORAL
  Administered 2019-09-04 – 2019-09-06 (×4): 50 mg via ORAL
  Filled 2019-09-03: qty 2
  Filled 2019-09-03: qty 1
  Filled 2019-09-03 (×3): qty 2

## 2019-09-03 MED ORDER — ENOXAPARIN SODIUM 40 MG/0.4ML ~~LOC~~ SOLN: 40.0000 mg | SUBCUTANEOUS | Status: DC

## 2019-09-03 MED ORDER — SODIUM CHLORIDE 0.9% FLUSH
3.0000 mL | INTRAVENOUS | Status: DC | PRN
  Administered 2019-09-03: 3 mL via INTRAVENOUS

## 2019-09-03 MED ORDER — SODIUM CHLORIDE 0.9 % IV SOLN: 250.0000 mL | INTRAVENOUS | Status: DC | PRN

## 2019-09-03 MED ORDER — IPRATROPIUM BROMIDE 0.02 % IN SOLN
0.5000 mg | Freq: Once | RESPIRATORY_TRACT | Status: AC
  Administered 2019-09-03: 0.5 mg via RESPIRATORY_TRACT
  Filled 2019-09-03: qty 2.5

## 2019-09-03 MED ORDER — NICOTINE 21 MG/24HR TD PT24
21.0000 mg | MEDICATED_PATCH | Freq: Every day | TRANSDERMAL | Status: DC
  Administered 2019-09-03 – 2019-09-06 (×4): 21 mg via TRANSDERMAL
  Filled 2019-09-03 (×4): qty 1

## 2019-09-03 MED ORDER — FENTANYL CITRATE (PF) 100 MCG/2ML IJ SOLN
50.0000 ug | INTRAMUSCULAR | Status: AC | PRN
  Administered 2019-09-03 – 2019-09-04 (×5): 50 ug via INTRAVENOUS
  Filled 2019-09-03 (×5): qty 2

## 2019-09-03 NOTE — TOC Initial Note (Signed)
Transition of Care [facility]) - Initial/Assessment Note   Patient Details  Name: Samantha Mccarthy MRN: [id number] Date of Birth: Apr 12, 1985  Transition of [facility]) CM/SW Contact:    Sherie Don, LCSW Phone Number: 09/03/2019, 7:31 PM  Clinical Narrative: Patient is a 34 year old female who was admitted for a hypertensive emergency. TOC received consult for CHF screening. CSW met with patient to complete screening. Per patient, she lives in a single family home with her niece. She reported her only DME is a CPAP she got in 2017. Patient reported her sleep apnea has worsened and believes she will need a new sleep study. Patient reported she has no history of HH.              CSW asked about patient maintaining a heart healthy diet. Patient reported she is not consistent, but is trying to limit her fried foods and uses little to no salt. Patient reported she has not been restricting her fluid as she thought drinking lots of water was healthy for her. Patient is not currently weighing herself daily to monitor fluid retention.  CSW discussed Medicaid with patient. Patient reported she is trying to get Medicaid and showed CSW the consent forms and copies of her IDs for [employer]. CSW asked about PCP. Patient reported she has an appointment with [facility] Dept. 09/06/19 at 10:00am. CSW asked about medications. Patient stated, "I pick up what I can, usually from the $4 list." However, she reported she does not pick up medications that are $40-$50 as she cannot afford them. Patient stated, "I can't hold on to a job because of my health issues." Patient reported she was supposed to have orientation tomorrow at a new job. TOC to follow.  Expected Discharge Plan: Home/Self Care Barriers to Discharge: Continued Medical Work up  Patient Goals and CMS Choice Choice offered to / list presented to : NA  Expected Discharge Plan and Services Expected Discharge Plan: Home/Self Care In-house Referral:  Clinical Social Work, Copywriter, advertising Acute Care Choice: NA Living arrangements for the past 2 months: Single Family Home               DME Arranged: N/A DME Agency: NA HH Arranged: NA [place] Agency: NA  Prior Living Arrangements/Services Living arrangements for the past 2 months: [place] with:: Relatives(Lives w/niece) Patient language and need for interpreter reviewed:: Yes Do you feel safe going back to the place where you live?: Yes      Need for Family Participation in Patient Care: No (Comment) Care giver support system in place?: Yes (comment)(Martha Layne Benton (mother) PH: [phone]) Current home services: DME(CPAP) Criminal Activity/Legal Involvement Pertinent to Current Situation/Hospitalization: No - Comment as needed  Activities of Daily Living Home Assistive Devices/Equipment: None ADL Screening (condition at time of admission) Patient's cognitive ability adequate to safely complete daily activities?: Yes Is the patient deaf or have difficulty hearing?: No Does the patient have difficulty seeing, even when wearing glasses/contacts?: No Does the patient have difficulty concentrating, remembering, or making decisions?: No Patient able to express need for assistance with ADLs?: Yes Does the patient have difficulty dressing or bathing?: No Independently performs ADLs?: Yes (appropriate for developmental age) Does the patient have difficulty walking or climbing stairs?: No Weakness of Legs: None Weakness of Arms/Hands: None  Permission Sought/Granted Permission granted to share information with : Yes, Verbal Permission Granted  Permission granted to share info w AGENCY: [employer] for assistance w/Medicaid  Emotional Assessment Appearance:: Appears stated age Attitude/Demeanor/Rapport: Engaged Affect (typically observed): Accepting, Appropriate Orientation: : Oriented to Self, Oriented to Place, Oriented to  Time, Oriented to Situation Alcohol  / Substance Use: Tobacco Use Psych Involvement: No (comment)  Admission diagnosis:  Pulmonary edema [J81.1] Hypertensive emergency [I16.1] Acute congestive heart failure, unspecified heart failure type [facility]) [I50.9] Patient Active Problem List   Diagnosis Date Noted  . Hypertensive emergency 09/03/2019  . Sleep apnea   . Hypertension   . Acute respiratory failure with hypoxia ([place])   . Volume overload   . Pulmonary edema   . Chronic kidney disease (CKD) stage G3a/A1, moderately decreased glomerular filtration rate (GFR) between 45-59 mL/min/1.73 square meter and albuminuria creatinine ratio less than 30 mg/g   . Acute systolic CHF (congestive heart failure) ([place]) 07/01/2019  . Cocaine abuse ([place]) 07/01/2019  . Acute CHF (congestive heart failure) ([place]) 06/30/2019  . Hypertensive urgency 06/30/2019  . Acute renal failure superimposed on stage 3a chronic kidney disease ([place]) 06/30/2019  . Morbid obesity with BMI of 50.0-59.9, adult ([place]) 06/30/2019  . Tobacco abuse 06/30/2019   PCP:  Health, [place]:   [facility] [address] [place] [address] Phone: [phone] Fax: [phone]  Readmission Risk Interventions Readmission Risk Prevention Plan 09/03/2019  Transportation Screening Complete  PCP or Specialist Appt within 3-5 Days Complete  HRI or Home Care Consult Complete  Social Work Consult for [place] Planning/Counseling Complete  Palliative Care Screening Not Applicable  Medication Review Press photographer) Not Complete  Some recent data might be hidden

## 2019-09-03 NOTE — ED Triage Notes (Signed)
Pt c/o of sob and wheezing since yesterday.

## 2019-09-03 NOTE — ED Notes (Signed)
Pt placed on purewick 

## 2019-09-03 NOTE — ED Provider Notes (Signed)
[facility] EMERGENCY DEPARTMENT Provider Note   CSN: [id number] Arrival date & time: 09/03/19  [id number]     History Chief Complaint  Patient presents with  . Shortness of Breath    Samantha Mccarthy is a 34 y.o. female.  She has a history of hypertension CHF morbid obesity.  Complaining of increased shortness of breath since yesterday.  EMS found her to be tachypneic and hypertensive with sats around 90% on room air.  Given Nitropaste and CPAP with improvement in her breathing.  Patient states she has had a cough with some white sputum.  No fever.  Had some chest pain earlier but none now.  Level 5 caveat secondary to respiratory distress.  The history is provided by the patient and the EMS personnel.  Shortness of Breath Severity:  Severe Onset quality:  Gradual Duration:  2 days Timing:  Intermittent Progression:  Worsening Chronicity:  Recurrent Relieved by:  Nothing Worsened by:  Activity Ineffective treatments:  Rest Associated symptoms: chest pain, cough and sputum production   Associated symptoms: no abdominal pain, no fever, no headaches, no hemoptysis, no neck pain, no rash, no sore throat, no syncope and no vomiting   Risk factors: obesity and tobacco use        Past Medical History:  Diagnosis Date  . CHF (congestive heart failure) (HCC)   . Hypertension   . Sleep apnea     Patient Active Problem List   Diagnosis Date Noted  . Acute systolic CHF (congestive heart failure) (HCC) 07/01/2019  . Cocaine abuse (HCC) 07/01/2019  . Acute CHF (congestive heart failure) (HCC) 06/30/2019  . Hypertensive urgency 06/30/2019  . Acute renal failure superimposed on stage 3a chronic kidney disease (HCC) 06/30/2019  . Morbid obesity with BMI of 50.0-59.9, adult (HCC) 06/30/2019  . Tobacco abuse 06/30/2019    History reviewed. No pertinent surgical history.   OB History    Gravida      Para      Term      Preterm      AB      Living  0     SAB      TAB        Ectopic      Multiple      Live Births              Family History  Problem Relation Age of Onset  . Cancer Mother   . Heart failure Father   . Diabetes Other     Social History   Tobacco Use  . Smoking status: Current Every Day Smoker    Packs/day: 0.30    Years: 7.00    Pack years: 2.10    Types: Cigarettes  . Smokeless tobacco: Never Used  Substance Use Topics  . Alcohol use: Yes    Comment: Occasionally  . Drug use: No    Home Medications Prior to Admission medications   Medication Sig Start Date End Date Taking? Authorizing Provider  albuterol (VENTOLIN HFA) 108 (90 Base) MCG/ACT inhaler Inhale 2 puffs into the lungs every 4 (four) hours as needed for wheezing or shortness of breath (as needed for shortness of breath). 08/12/19   Farrel GordonPatel, Shalyn, PA-C  azithromycin (ZITHROMAX) 500 MG tablet Take 1 tablet (500 mg total) by mouth daily. Patient not taking: Reported on 08/12/2019 07/03/19   Catarina Hartshornat, David, MD  carvedilol (COREG) 12.5 MG tablet Take 1 tablet (12.5 mg total) by mouth 2 (two) times daily with a  meal. Patient taking differently: Take 25 mg by mouth 2 (two) times daily with a meal.  07/02/19   Tat, Onalee Hua, MD  cefdinir (OMNICEF) 300 MG capsule Take 1 capsule (300 mg total) by mouth 2 (two) times daily. Patient not taking: Reported on 08/12/2019 07/02/19   Catarina Hartshorn, MD  doxycycline (VIBRAMYCIN) 100 MG capsule Take 1 capsule (100 mg total) by mouth 2 (two) times daily. 08/12/19   Farrel Gordon, PA-C  furosemide (LASIX) 20 MG tablet Take 1 tablet (20 mg total) by mouth daily. 07/03/19   Catarina Hartshorn, MD  hydrALAZINE (APRESOLINE) 25 MG tablet Take 25 mg by mouth 3 (three) times daily. 08/09/19   [provider]  hydrOXYzine (VISTARIL) 25 MG capsule Take 25-50 mg by mouth every 6 (six) hours as needed. 08/09/19   [provider]  traMADol (ULTRAM) 50 MG tablet Take 1 tablet (50 mg total) by mouth every 6 (six) hours as needed. Patient not taking:  Reported on 08/12/2019 03/08/16   Pauline Aus, PA-C    Allergies    Codeine, Hydrocodone-acetaminophen, Penicillins, and Tylenol [acetaminophen]  Review of Systems   Review of Systems  Constitutional: Negative for fever.  HENT: Negative for sore throat.   Eyes: Negative for visual disturbance.  Respiratory: Positive for cough, sputum production and shortness of breath. Negative for hemoptysis.   Cardiovascular: Positive for chest pain. Negative for syncope.  Gastrointestinal: Negative for abdominal pain and vomiting.  Genitourinary: Negative for dysuria.  Musculoskeletal: Negative for neck pain.  Skin: Negative for rash.  Neurological: Negative for headaches.    Physical Exam Updated Vital Signs BP (!) 223/137   Pulse 81   Resp (!) 28   Ht 5\' 6"  (1.676 m)   Wt (!) 164.2 kg   SpO2 100%   BMI 58.43 kg/m   Physical Exam Vitals and nursing note reviewed.  Constitutional:      General: She is not in acute distress.    Appearance: She is well-developed. She is obese.  HENT:     Head: Normocephalic and atraumatic.  Eyes:     Conjunctiva/sclera: Conjunctivae normal.  Cardiovascular:     Rate and Rhythm: Normal rate and regular rhythm.     Heart sounds: No murmur.  Pulmonary:     Effort: Tachypnea, accessory muscle usage and respiratory distress present.     Breath sounds: Examination of the right-lower field reveals rales. Examination of the left-lower field reveals rales. Rales present.  Abdominal:     Palpations: Abdomen is soft.     Tenderness: There is no abdominal tenderness.  Musculoskeletal:        General: No deformity or signs of injury.     Cervical back: Neck supple.  Skin:    General: Skin is warm and dry.     Capillary Refill: Capillary refill takes less than 2 seconds.  Neurological:     General: No focal deficit present.     Mental Status: She is alert.     ED Results / Procedures / Treatments   Labs (all labs ordered are listed, but only  abnormal results are displayed) Labs Reviewed  BASIC METABOLIC PANEL - Abnormal; Notable for the following components:      Result Value   BUN 25 (*)    Creatinine, Ser 1.95 (*)    Calcium 8.3 (*)    GFR calc non Af Amer 33 (*)    GFR calc Af Amer 38 (*)    All other components within normal limits  BRAIN NATRIURETIC PEPTIDE - Abnormal; Notable for the following components:   B Natriuretic Peptide 1,486.0 (*)    All other components within normal limits  CBC WITH DIFFERENTIAL/PLATELET - Abnormal; Notable for the following components:   Hemoglobin 11.7 (*)    MCH 23.7 (*)    MCHC 29.4 (*)    RDW 18.1 (*)    All other components within normal limits  TROPONIN I (HIGH SENSITIVITY) - Abnormal; Notable for the following components:   Troponin I (High Sensitivity) 35 (*)    All other components within normal limits  TROPONIN I (HIGH SENSITIVITY) - Abnormal; Notable for the following components:   Troponin I (High Sensitivity) 30 (*)    All other components within normal limits  SARS CORONAVIRUS 2 BY RT PCR (HOSPITAL ORDER, PERFORMED IN [facility])  MRSA PCR SCREENING  HCG, SERUM, QUALITATIVE  RAPID URINE DRUG SCREEN, HOSP PERFORMED  HEMOGLOBIN A1C  MAGNESIUM  BRAIN NATRIURETIC PEPTIDE  COMPREHENSIVE METABOLIC PANEL  CBC WITH DIFFERENTIAL/PLATELET  TSH  LIPID PANEL    EKG EKG Interpretation  Date/Time:  Tuesday September 03 2019 09:45:09 EDT Ventricular Rate:  80 PR Interval:    QRS Duration: 107 QT Interval:  437 QTC Calculation: 505 R Axis:   -18 Text Interpretation: Sinus rhythm Left atrial enlargement LVH with secondary repolarization abnormality Prolonged QT interval No significant change since prior 5/21 Confirmed by Meridee Score [phone]) on 09/03/2019 10:09:14 AM   Radiology DG Chest Port 1 View  Result Date: 09/03/2019 CLINICAL DATA:  Shortness of breath EXAM: PORTABLE CHEST 1 VIEW COMPARISON:  08/12/2019 FINDINGS: Stable mild cardiomegaly. Slightly  decreased lung volumes. Prominent bilateral perihilar and bibasilar interstitial markings. No focal airspace consolidation is evident. No large pleural fluid collection. No pneumothorax. IMPRESSION: Findings suggestive of CHF with mild interstitial edema. Electronically Signed   By: Duanne Guess D.O.   On: 09/03/2019 10:18    Procedures .Critical Care Performed by: Terrilee Files, MD Authorized by: Terrilee Files, MD   Critical care provider statement:    Critical care time (minutes):  45   Critical care time was exclusive of:  Separately billable procedures and treating other patients   Critical care was necessary to treat or prevent imminent or life-threatening deterioration of the following conditions:  Cardiac failure and respiratory failure   Critical care was time spent personally by me on the following activities:  Discussions with consultants, evaluation of patient's response to treatment, examination of patient, ordering and performing treatments and interventions, ordering and review of laboratory studies, ordering and review of radiographic studies, pulse oximetry, re-evaluation of patient's condition, obtaining history from patient or surrogate, review of old charts, development of treatment plan with patient or surrogate and ventilator management   I assumed direction of critical care for this patient from another provider in my specialty: no     (including critical care time)  Medications Ordered in ED Medications  nitroGLYCERIN 50 mg in dextrose 5 % 250 mL (0.2 mg/mL) infusion (60 mcg/min Intravenous Rate/Dose Verify 09/03/19 1854)  sodium chloride flush (NS) 0.9 % injection 3 mL (3 mLs Intravenous Given 09/03/19 1454)  sodium chloride flush (NS) 0.9 % injection 3 mL (3 mLs Intravenous Given 09/03/19 1433)  0.9 %  sodium chloride infusion (has no administration in time range)  ondansetron (ZOFRAN) injection 4 mg (has no administration in time range)  furosemide (LASIX)  injection 40 mg (40 mg Intravenous Given 09/03/19 1714)  zolpidem (AMBIEN) tablet 5 mg (has  no administration in time range)  ipratropium-albuterol (DUONEB) 0.5-2.5 (3) MG/3ML nebulizer solution 3 mL (3 mLs Nebulization Given 09/03/19 1530)  nicotine (NICODERM CQ - dosed in mg/24 hours) patch 21 mg (21 mg Transdermal Patch Applied 09/03/19 1500)  enoxaparin (LOVENOX) injection 80 mg (80 mg Subcutaneous Given 09/03/19 1544)  fentaNYL (SUBLIMAZE) injection 50 mcg (50 mcg Intravenous Given 09/03/19 1838)  albuterol (PROVENTIL) (2.5 MG/3ML) 0.083% nebulizer solution 5 mg (5 mg Nebulization Given 09/03/19 1013)  ipratropium (ATROVENT) nebulizer solution 0.5 mg (0.5 mg Nebulization Given 09/03/19 1014)  furosemide (LASIX) injection 40 mg (40 mg Intravenous Given 09/03/19 1120)    ED Course  I have reviewed the triage vital signs and the nursing notes.  Pertinent labs & imaging results that were available during my care of the patient were reviewed by me and considered in my medical decision making (see chart for details).  Clinical Course as of Sep 02 1905  Tue Sep 03, 2019  8101 Cardiac echo 3/21 - 1. Poor image quality no definity given . Left ventricular ejection  fraction, by estimation, is 40 to 45%. The left ventricle has mildly  decreased function. The left ventricle demonstrates global hypokinesis.  The left ventricular internal cavity size  was mildly dilated. There is severe left ventricular hypertrophy. Left  ventricular diastolic parameters were normal.  2. Right ventricular systolic function is normal. The right ventricular  size is normal.  3. Left atrial size was moderately dilated.  4. The mitral valve is normal in structure. No evidence of mitral valve  regurgitation. No evidence of mitral stenosis.  5. The aortic valve is normal in structure. Aortic valve regurgitation is  not visualized. No aortic stenosis is present.  6. The inferior vena cava is normal in size with greater than  50%  respiratory variability, suggesting right atrial pressure of 3 mmHg.    [MB]  41 Discussed with [facility] hospitalist Dr. Wynetta Emery who will evaluate the patient for admission.   [MB]    Clinical Course User Index [MB] Hayden Rasmussen, MD   MDM Rules/Calculators/A&P                     This patient complains of increased shortness of breath over the past few days associated with some chest tightness and cough; this involves an extensive number of treatment Options and is a complaint that carries with it a high risk of complications and Morbidity. The differential includes CHF, COPD, pneumonia, bronchitis, Covid hypertensive emergency  I ordered, reviewed and interpreted labs, which included CBC with normal white count stable hemoglobin, chemistry showing creatinine elevated at 1.95, has been worse before troponin mildly elevated and BNP markedly elevated consistent with CHF.Pregnancy testing negative. I ordered medication nitroglycerin drip, IV Lasix for treatment of her acute CHF and elevated blood pressure I ordered imaging studies which included chest x-ray and I independently    visualized and interpreted imaging which showed cardiomegaly and CHF Previous records obtained and reviewed in epic including similar admissions in the past I consulted [facility] hospitalist Dr. Wynetta Emery and discussed lab and imaging findings  Critical Interventions: Identification of pain management of CHF and hypertensive emergency with IV medications  After the interventions stated above, I reevaluated the patient and found patient's blood pressure still be markedly elevated work of breathing is much improved.  She understands she will need to be admitted to the hospital for further management of this.   Final Clinical Impression(s) / ED Diagnoses Final diagnoses:  Hypertensive emergency  Acute congestive heart failure, unspecified heart failure type [facility])  Pulmonary edema    Rx / DC Orders ED  Discharge Orders    None       Terrilee Files, MD 09/03/19 1911

## 2019-09-03 NOTE — ED Notes (Signed)
Pt is morbidly obese   On bipap and speaking on cell phone   Awaiting bed assignment

## 2019-09-03 NOTE — Progress Notes (Signed)
**Note De-identified Samantha Mccarthy Obfuscation** Patient removed from BIPAP and placed on 4 L ; tolerating well.  RRT to continue to monitor 

## 2019-09-03 NOTE — H&P (Addendum)
History and Physical  [facility]  [place] HYQ:[id number] DOB: 08-08-1985 DOA: 09/03/2019  PCP: Health, [facility] Personal   Patient coming from: Home by EMS   I have personally briefly reviewed patient's old medical records in [facility] Link  Chief Complaint: SOB   HPI: Samantha Mccarthy is a 34 y.o. female with medical history significant of longstanding malignant hypertension that has been difficult to control since she became an adult, systolic congestive heart failure (EF 40-45%) as of 3/21, OSA not using home CPAP, smoking, cocaine use,  Morbid obesity who was hospitalized in March 2021 for systolic CHF exacerbation and who just completed a course of antibiotics for a left lower lobe pneumonia last month presents with progressive SOB and wheezing.  She denies CP. She reports headache. She reports chest tightness.  She says that lying recumbent worsens the chest tightness. She reports that she noticed symptoms getting worse yesterday. She denies having fever or chills.  She reports having occasional palpitations.  She is having increased swelling in the legs and her eyes have been hurting.  She has chronic headaches.  She has been out of her blood pressure medications but reports she recently established primary care at [facility] department and was placed on affordable medications.  She is not using CPAP due to problem with her home equipment and she likely is going to need a new sleep study. Her last sleep study was in 2017.      ED Course: [facility] found patient hypoxic in 80s and placed her on bipap and brought to ED. She was maintained on bipap for several hours. Her BP was 212/131, 220/140 and she was started on a nitroglycerin infusion and IV lasix 40 mg was given and admission to hospital was requested.  Her HS troponin was 35 and repeat was 30.  Her BNP was 1486.  Her creatinine was 1.95. Hg was 11.7.  WBC was 7.1.  Platelet count was 256.  Sars 2  coronavirus was negative. CXR with findings of CHF.  EKG with SR LVH and prolonged QT.   Review of Systems: As per HPI otherwise 10 point review of systems negative.    Past Medical History:  Diagnosis Date  . CHF (congestive heart failure) (HCC)   . Hypertension   . Sleep apnea     History reviewed. No pertinent surgical history.   reports that she has been smoking cigarettes. She has a 2.10 pack-year smoking history. She has never used smokeless tobacco. She reports current alcohol use. She reports that she does not use drugs.  Allergies  Allergen Reactions  . Codeine Anaphylaxis  . Hydrocodone-Acetaminophen Shortness Of Breath, Itching and Swelling  . Penicillins Hives    Has patient had a PCN reaction causing immediate rash, facial/tongue/throat swelling, SOB or lightheadedness with hypotension: No Has patient had a PCN reaction causing severe rash involving mucus membranes or skin necrosis: Yes Has patient had a PCN reaction that required hospitalization No Has patient had a PCN reaction occurring within the last 10 years: Yes If all of the above answers are "NO", then may proceed with Cephalosporin use.   . Tylenol [Acetaminophen] Hives    Family History  Problem Relation Age of Onset  . Cancer Mother   . Heart failure Father   . Diabetes Other      Prior to Admission medications   Medication Sig Start Date End Date Taking? Authorizing Provider  albuterol (VENTOLIN HFA) 108 (90 Base) MCG/ACT  inhaler Inhale 2 puffs into the lungs every 4 (four) hours as needed for wheezing or shortness of breath (as needed for shortness of breath). 08/12/19   Alfredia Client, PA-C  azithromycin (ZITHROMAX) 500 MG tablet Take 1 tablet (500 mg total) by mouth daily. Patient not taking: Reported on 08/12/2019 07/03/19   Orson Eva, MD  carvedilol (COREG) 12.5 MG tablet Take 1 tablet (12.5 mg total) by mouth 2 (two) times daily with a meal. Patient taking differently: Take 25 mg by mouth 2  (two) times daily with a meal.  07/02/19   Tat, Shanon Brow, MD  cefdinir (OMNICEF) 300 MG capsule Take 1 capsule (300 mg total) by mouth 2 (two) times daily. Patient not taking: Reported on 08/12/2019 07/02/19   Orson Eva, MD  doxycycline (VIBRAMYCIN) 100 MG capsule Take 1 capsule (100 mg total) by mouth 2 (two) times daily. 08/12/19   Alfredia Client, PA-C  furosemide (LASIX) 20 MG tablet Take 1 tablet (20 mg total) by mouth daily. 07/03/19   Orson Eva, MD  hydrALAZINE (APRESOLINE) 25 MG tablet Take 25 mg by mouth 3 (three) times daily. 08/09/19   [provider]  hydrOXYzine (VISTARIL) 25 MG capsule Take 25-50 mg by mouth every 6 (six) hours as needed. 08/09/19   [provider]  traMADol (ULTRAM) 50 MG tablet Take 1 tablet (50 mg total) by mouth every 6 (six) hours as needed. Patient not taking: Reported on 08/12/2019 03/08/16   Kem Parkinson, PA-C    Physical Exam: Vitals:   09/03/19 1014 09/03/19 1015 09/03/19 1030 09/03/19 1100  BP:   (!) 230/149 (!) 220/140  Pulse:  71 72 73  Resp:  (!) 24 (!) 23 19  Temp:      TempSrc:      SpO2: 100% 100% 100% 98%  Weight:      Height:       Constitutional: morbidly obese female, awake, alert, NAD, calm, off bipap on [place].  Eyes: PERRL, lids and conjunctivae normal ENMT: Mucous membranes are moist. Posterior pharynx clear of any exudate or lesions. Neck: normal, supple, no masses, no thyromegaly, JVD 1+ Respiratory: crackles at bases bilaterally, diffuse expiratory wheezing. Moderate increased work of breathing and accessory muscle use.  Cardiovascular: normal s1,s2 sounds, 1+ bilateral extremity edema. 2+ pedal pulses. No carotid bruits.  Abdomen: obese, no tenderness, no masses palpated. No hepatosplenomegaly. Bowel sounds positive.  Musculoskeletal: no clubbing / cyanosis. No joint deformity upper and lower extremities. Good ROM, no contractures. Normal muscle tone.  Skin: no rashes, lesions, ulcers. No induration Neurologic: CN 2-12  grossly intact. Sensation intact, DTR normal. Strength 5/5 in all 4.  Psychiatric: Normal judgment and insight. Alert and oriented x 3. Normal mood.   Labs on Admission: I have personally reviewed following labs and imaging studies  CBC: Recent Labs  Lab 09/03/19 1006  WBC 7.1  NEUTROABS 4.0  HGB 11.7*  HCT 39.8  MCV 80.6  PLT 951   Basic Metabolic Panel: Recent Labs  Lab 09/03/19 1006  NA 140  K 4.0  CL 104  CO2 26  GLUCOSE 93  BUN 25*  CREATININE 1.95*  CALCIUM 8.3*   GFR: Estimated Creatinine Clearance: 65 mL/min (A) (by C-G formula based on SCr of 1.95 mg/dL (H)). Liver Function Tests: No results for input(s): AST, ALT, ALKPHOS, BILITOT, PROT, ALBUMIN in the last 168 hours. No results for input(s): LIPASE, AMYLASE in the last 168 hours. No results for input(s): AMMONIA in the last 168 hours. Coagulation Profile:  No results for input(s): INR, PROTIME in the last 168 hours. Cardiac Enzymes: No results for input(s): CKTOTAL, CKMB, CKMBINDEX, TROPONINI in the last 168 hours. BNP (last 3 results) No results for input(s): PROBNP in the last 8760 hours. HbA1C: No results for input(s): HGBA1C in the last 72 hours. CBG: No results for input(s): GLUCAP in the last 168 hours. Lipid Profile: No results for input(s): CHOL, HDL, LDLCALC, TRIG, CHOLHDL, LDLDIRECT in the last 72 hours. Thyroid Function Tests: No results for input(s): TSH, T4TOTAL, FREET4, T3FREE, THYROIDAB in the last 72 hours. Anemia Panel: No results for input(s): VITAMINB12, FOLATE, FERRITIN, TIBC, IRON, RETICCTPCT in the last 72 hours. Urine analysis:    Component Value Date/Time   COLORURINE STRAW (A) 06/30/2019 1730   APPEARANCEUR CLEAR 06/30/2019 1730   LABSPEC 1.005 06/30/2019 1730   PHURINE 7.0 06/30/2019 1730   GLUCOSEU NEGATIVE 06/30/2019 1730   HGBUR NEGATIVE 06/30/2019 1730   BILIRUBINUR NEGATIVE 06/30/2019 1730   KETONESUR NEGATIVE 06/30/2019 1730   PROTEINUR NEGATIVE 06/30/2019 1730    UROBILINOGEN 1.0 06/30/2013 1835   NITRITE NEGATIVE 06/30/2019 1730   LEUKOCYTESUR NEGATIVE 06/30/2019 1730    Radiological Exams on Admission: DG Chest Port 1 View  Result Date: 09/03/2019 CLINICAL DATA:  Shortness of breath EXAM: PORTABLE CHEST 1 VIEW COMPARISON:  08/12/2019 FINDINGS: Stable mild cardiomegaly. Slightly decreased lung volumes. Prominent bilateral perihilar and bibasilar interstitial markings. No focal airspace consolidation is evident. No large pleural fluid collection. No pneumothorax. IMPRESSION: Findings suggestive of CHF with mild interstitial edema. Electronically Signed   By: Duanne Guess D.O.   On: 09/03/2019 10:18   EKG: Independently reviewed. NSR, LVH, prolonged QT  Assessment/Plan Principal Problem:   Hypertensive emergency Active Problems:   Acute CHF (congestive heart failure) (HCC)   Morbid obesity with BMI of 50.0-59.9, adult (HCC)   Tobacco abuse   Cocaine abuse (HCC)   Sleep apnea   Hypertension   Acute respiratory failure with hypoxia (HCC)   Volume overload   Pulmonary edema   Chronic kidney disease (CKD) stage G3a/A1, moderately decreased glomerular filtration rate (GFR) between 45-59 mL/min/1.73 square meter and albuminuria creatinine ratio less than 30 mg/g   1. Hypertensive emergency - 34 y/o female presents with malignant hypertension and acute pulmonary edema with DOE, orthopnea and hypoxia - she has been admitted to ICU and started on nitroglycerin infusion with goal to lower BP slowly by 10-20%.  Continue IV lasix as ordered.  Follow lytes.   2. Acute respiratory failure with hypoxia - Pt was on bipap but now has been placed on [place].  Will follow in ICU.  3. Acute systolic CHF exacerbation - continue IV lasix, monitor diuresis, monitor renal function and electrolytes closely, monitor weight, she had a recent 2 D Echo 3 months ago with EF 40-50% and global hypokinesis seen.  4. Stage 3a CKD - monitor creatinine closely in setting of IV lasix  diuresis.   5. History of cocaine - Avoiding beta blocker for now, check urine drug screen.  6. Tobacco - counseled on smoking cessation and providing nicotine patch while in hospital.  7. OSA - noncompliant with home CPAP, will offer CPaP while in hospital.  8. Morbid obesity - check 25-OH vitamin D.  9. Prolonged QT - monitor telemetry, check magnesium.  10. Headaches -check CT head. Likely multifactorial given her severely elevated BP and nitroglycerin infusion. Treatment options limited as she reports cannot take tylenol and has anaphylaxis to codeine.  She has taken fentanyl in  past in ED and will try again, follow in ICU for reaction.   DVT prophylaxis: lovenox   Code Status: Full   Family Communication:  Spoke with grandmother (telephone) Disposition Plan: Home   Consults called:   Admission status: INP  Critical Care Procedure Note Authorized and Performed by: Maryln Manuel MD  Total Critical Care time:  65 mins Due to a high probability of clinically significant, life threatening deterioration, the patient required my highest level of preparedness to intervene emergently and I personally spent this critical care time directly and personally managing the patient.  This critical care time included obtaining a history; examining the patient, pulse oximetry; ordering and review of studies; arranging urgent treatment with development of a management plan; evaluation of patient's response of treatment; frequent reassessment; and discussions with other providers.  This critical care time was performed to assess and manage the high probability of imminent and life threatening deterioration that could result in multi-organ failure.  It was exclusive of separately billable procedures and treating other patients and teaching time.     Standley Dakins MD [facility] How to contact the [facility] Attending or Consulting provider 7A - 7P or covering provider during after hours 7P -7A, for this  patient?  1. Check the care team in [facility] and look for a) attending/consulting TRH provider listed and b) the [facility] team listed 2. Log into [email or web address] and use [place]'s universal password to access. If you do not have the password, please contact the hospital operator. 3. Locate the [facility] provider you are looking for under [facility] and page to a number that you can be directly reached. 4. If you still have difficulty reaching the provider, please page the [facility] (Director on Call) for the Hospitalists listed on amion for assistance.   If 7PM-7AM, please contact night-coverage [email or web address] Password [email or web address]  09/03/2019, 11:30 AM

## 2019-09-04 ENCOUNTER — Inpatient Hospital Stay (HOSPITAL_COMMUNITY): Payer: Medicaid Other

## 2019-09-04 DIAGNOSIS — I5023 Acute on chronic systolic (congestive) heart failure: Secondary | ICD-10-CM

## 2019-09-04 DIAGNOSIS — N1831 Chronic kidney disease, stage 3a: Secondary | ICD-10-CM

## 2019-09-04 DIAGNOSIS — I161 Hypertensive emergency: Secondary | ICD-10-CM

## 2019-09-04 DIAGNOSIS — Z6841 Body Mass Index (BMI) 40.0 and over, adult: Secondary | ICD-10-CM

## 2019-09-04 DIAGNOSIS — J9601 Acute respiratory failure with hypoxia: Secondary | ICD-10-CM

## 2019-09-04 LAB — COMPREHENSIVE METABOLIC PANEL
ALT: 31 U/L (ref 0–44)
AST: 18 U/L (ref 15–41)
Albumin: 3 g/dL — ABNORMAL LOW (ref 3.5–5.0)
Alkaline Phosphatase: 68 U/L (ref 38–126)
Anion gap: 10 (ref 5–15)
BUN: 23 mg/dL — ABNORMAL HIGH (ref 6–20)
CO2: 27 mmol/L (ref 22–32)
Calcium: 8.5 mg/dL — ABNORMAL LOW (ref 8.9–10.3)
Chloride: 102 mmol/L (ref 98–111)
Creatinine, Ser: 1.74 mg/dL — ABNORMAL HIGH (ref 0.44–1.00)
GFR calc Af Amer: 44 mL/min — ABNORMAL LOW (ref >60)
GFR calc non Af Amer: 38 mL/min — ABNORMAL LOW (ref >60)
Glucose, Bld: 99 mg/dL (ref 70–99)
Potassium: 3.6 mmol/L (ref 3.5–5.1)
Sodium: 139 mmol/L (ref 135–145)
Total Bilirubin: 0.8 mg/dL (ref 0.3–1.2)
Total Protein: 6.7 g/dL (ref 6.5–8.1)

## 2019-09-04 LAB — CBC WITH DIFFERENTIAL/PLATELET
Abs Immature Granulocytes: 0.03 10*3/uL (ref 0.00–0.07)
Basophils Absolute: 0 10*3/uL (ref 0.0–0.1)
Basophils Relative: 0 %
Eosinophils Absolute: 0.1 10*3/uL (ref 0.0–0.5)
Eosinophils Relative: 1 %
HCT: 39.9 % (ref 36.0–46.0)
Hemoglobin: 11.9 g/dL — ABNORMAL LOW (ref 12.0–15.0)
Immature Granulocytes: 0 %
Lymphocytes Relative: 23 %
Lymphs Abs: 2.1 10*3/uL (ref 0.7–4.0)
MCH: 23.7 pg — ABNORMAL LOW (ref 26.0–34.0)
MCHC: 29.8 g/dL — ABNORMAL LOW (ref 30.0–36.0)
MCV: 79.3 fL — ABNORMAL LOW (ref 80.0–100.0)
Monocytes Absolute: 0.8 10*3/uL (ref 0.1–1.0)
Monocytes Relative: 8 %
Neutro Abs: 6.1 10*3/uL (ref 1.7–7.7)
Neutrophils Relative %: 68 %
Platelets: 234 10*3/uL (ref 150–400)
RBC: 5.03 MIL/uL (ref 3.87–5.11)
RDW: 18.1 % — ABNORMAL HIGH (ref 11.5–15.5)
WBC: 9 10*3/uL (ref 4.0–10.5)
nRBC: 0 % (ref 0.0–0.2)

## 2019-09-04 LAB — LIPID PANEL
Cholesterol: 200 mg/dL (ref 0–200)
HDL: 60 mg/dL (ref >40)
LDL Cholesterol: 121 mg/dL — ABNORMAL HIGH (ref 0–99)
Total CHOL/HDL Ratio: 3.3 RATIO
Triglycerides: 95 mg/dL (ref <150)
VLDL: 19 mg/dL (ref 0–40)

## 2019-09-04 LAB — BRAIN NATRIURETIC PEPTIDE: B Natriuretic Peptide: 858 pg/mL — ABNORMAL HIGH (ref 0.0–100.0)

## 2019-09-04 LAB — TSH: TSH: 2.127 u[IU]/mL (ref 0.350–4.500)

## 2019-09-04 LAB — MAGNESIUM: Magnesium: 1.7 mg/dL (ref 1.7–2.4)

## 2019-09-04 LAB — MRSA PCR SCREENING: MRSA by PCR: NEGATIVE

## 2019-09-04 MED ORDER — BUDESONIDE 0.5 MG/2ML IN SUSP
0.5000 mg | Freq: Two times a day (BID) | RESPIRATORY_TRACT | Status: DC
  Administered 2019-09-04 – 2019-09-06 (×4): 0.5 mg via RESPIRATORY_TRACT
  Filled 2019-09-04 (×4): qty 2

## 2019-09-04 MED ORDER — HYDRALAZINE HCL 25 MG PO TABS
25.0000 mg | ORAL_TABLET | Freq: Three times a day (TID) | ORAL | Status: DC
  Administered 2019-09-04 – 2019-09-05 (×4): 25 mg via ORAL
  Filled 2019-09-04 (×4): qty 1

## 2019-09-04 MED ORDER — PROCHLORPERAZINE EDISYLATE 10 MG/2ML IJ SOLN
5.0000 mg | Freq: Once | INTRAMUSCULAR | Status: AC
  Administered 2019-09-04: 5 mg via INTRAVENOUS
  Filled 2019-09-04: qty 2

## 2019-09-04 MED ORDER — DIPHENHYDRAMINE HCL 50 MG/ML IJ SOLN
12.5000 mg | Freq: Once | INTRAMUSCULAR | Status: AC
  Administered 2019-09-04: 12.5 mg via INTRAVENOUS
  Filled 2019-09-04: qty 1

## 2019-09-04 MED ORDER — CARVEDILOL 12.5 MG PO TABS
25.0000 mg | ORAL_TABLET | Freq: Two times a day (BID) | ORAL | Status: DC
  Administered 2019-09-04 – 2019-09-06 (×5): 25 mg via ORAL
  Filled 2019-09-04 (×5): qty 2

## 2019-09-04 MED ORDER — IPRATROPIUM-ALBUTEROL 0.5-2.5 (3) MG/3ML IN SOLN
3.0000 mL | Freq: Four times a day (QID) | RESPIRATORY_TRACT | Status: DC
  Administered 2019-09-04 – 2019-09-06 (×7): 3 mL via RESPIRATORY_TRACT
  Filled 2019-09-04 (×7): qty 3

## 2019-09-04 MED ORDER — FENTANYL CITRATE (PF) 100 MCG/2ML IJ SOLN
25.0000 ug | INTRAMUSCULAR | Status: DC | PRN
  Administered 2019-09-05: 25 ug via INTRAVENOUS
  Filled 2019-09-04: qty 2

## 2019-09-04 NOTE — Progress Notes (Signed)
PROGRESS NOTE  Samantha Mccarthy QQV:[id number] DOB: 16-Oct-1985 DOA: 09/03/2019 PCP: Health, [facility] Dept Personal  Brief History:  34 year old female with a history of systolic CHF (EF 33-29%), hypertension, OSA, gouty arthritis, tobacco abuse, morbid obesity presenting with 2-day history of chest discomfort, shortness of breath, and worsening lower extremity edema.  The patient had told of the providers that she had run out of her medications which she has been receiving from the [facility] department.  However, the patient tried to tell me that she had been 100% compliant with all her medications without any lapses in therapy.  Nevertheless, the patient was recently mated to the hospital from 06/30/2019 to 07/02/2019 for acute systolic CHF and acute on chronic renal failure.  During that hospital admission, the patient was also positive for cocaine.  She was discharged home with furosemide 20 mg daily.  Discharge weight at that time was 164.5 kg.  Upon presentation, the patient was noted to be hypoxic with oxygen saturation in the 80s.  The patient wason BiPAP and started on IV furosemide.  Chest x-ray showed interstitial edema.  The patient was also noted to have significant elevation of her blood pressure up to 243/108.  He was started on nicardipine drip.  Assessment/Plan: Acute on chronic systolic CHF -07/01/19 EchocardiogramEF 40-45%, global HK -Continue IV furosemide 40 mg bid during hospitalization -Daily weights -Accurate I's and O's--incomplete -restart carvedilol  HTN emergency -Restart carvedilol--increased to 25 mg twice daily -Restart hydralazine -Wean nicardipine drip for systolic blood pressure <180  Acute respiratory failure with hypoxia -Secondary to pulmonary edema -Initially placed on BiPAP -Now stable on 2 L nasal cannula -Wean oxygen for saturation greater 92%  Cocaine abuse -pt states she has only used twice -avoiding BB without  alpha blockade -repeat UDS--neg  CKD stage IIIa -Baseline creatinine 1.5-1.9 -Monitor with diuresis  Morbid obesity -BMI 73.60 -Lifestyle modification  Tobacco abuse -Tobacco cessation discussed     Status is: Inpatient  Remains inpatient appropriate because:Hemodynamically unstable--requiring nicardipine drip for HTN emergency; remains on IV lasix for fluid overload   Dispo: The patient is from: Home              Anticipated d/c is to: Home              Anticipated d/c date is: 2 days              Patient currently is not medically stable to d/c.         Family Communication:   No Family at bedside  Consultants:  none  Code Status:  FULL   DVT Prophylaxis:   [place] Lovenox   Procedures: As Listed in Progress Note Above  Antibiotics: None       Subjective: Patient states her breathing is improved.  She has no chest pain presently.  She still has some dyspnea on exertion.  She denies any nausea, vomiting, diarrhea, abdominal pain, fevers, chills.  Headache is improved.  Objective: Vitals:   09/04/19 0700 09/04/19 0715 09/04/19 0730 09/04/19 0745  BP: (!) 182/81 (!) 160/90 (!) 156/73 (!) 156/69  Pulse: 86 79 80 85  Resp: (!) 27 (!) 23 (!) 24 (!) 21  Temp:      TempSrc:      SpO2: 98% 99% 99% 100%  Weight:      Height:        Intake/Output Summary (Last 24 hours) at 09/04/2019 0758 Last  data filed at 09/04/2019 0500 Gross per 24 hour  Intake 728.77 ml  Output 4200 ml  Net -3471.23 ml   Weight change:  Exam:   General:  Pt is alert, follows commands appropriately, not in acute distress  HEENT: No icterus, No thrush, No neck mass, [place]/AT  Cardiovascular: RRR, S1/S2, no rubs, no gallops  Respiratory: Bilateral crackles but no wheezing.  Good air movement  Abdomen: Soft/+BS, non tender, non distended, no guarding  Extremities: 1 +LE edema, No lymphangitis, No petechiae, No rashes, no synovitis   Data Reviewed: I have personally reviewed  following labs and imaging studies Basic Metabolic Panel: Recent Labs  Lab 09/03/19 1006 09/04/19 0457  NA 140 139  K 4.0 3.6  CL 104 102  CO2 26 27  GLUCOSE 93 99  BUN 25* 23*  CREATININE 1.95* 1.74*  CALCIUM 8.3* 8.5*  MG  --  1.7   Liver Function Tests: Recent Labs  Lab 09/04/19 0457  AST 18  ALT 31  ALKPHOS 68  BILITOT 0.8  PROT 6.7  ALBUMIN 3.0*   No results for input(s): LIPASE, AMYLASE in the last 168 hours. No results for input(s): AMMONIA in the last 168 hours. Coagulation Profile: No results for input(s): INR, PROTIME in the last 168 hours. CBC: Recent Labs  Lab 09/03/19 1006 09/04/19 0457  WBC 7.1 9.0  NEUTROABS 4.0 6.1  HGB 11.7* 11.9*  HCT 39.8 39.9  MCV 80.6 79.3*  PLT 256 234   Cardiac Enzymes: No results for input(s): CKTOTAL, CKMB, CKMBINDEX, TROPONINI in the last 168 hours. BNP: Invalid input(s): POCBNP CBG: No results for input(s): GLUCAP in the last 168 hours. HbA1C: Recent Labs    09/03/19 1006  HGBA1C 6.3*   Urine analysis:    Component Value Date/Time   COLORURINE STRAW (A) 06/30/2019 1730   APPEARANCEUR CLEAR 06/30/2019 1730   LABSPEC 1.005 06/30/2019 1730   PHURINE 7.0 06/30/2019 1730   GLUCOSEU NEGATIVE 06/30/2019 1730   HGBUR NEGATIVE 06/30/2019 1730   BILIRUBINUR NEGATIVE 06/30/2019 1730   KETONESUR NEGATIVE 06/30/2019 1730   PROTEINUR NEGATIVE 06/30/2019 1730   UROBILINOGEN 1.0 06/30/2013 1835   NITRITE NEGATIVE 06/30/2019 1730   LEUKOCYTESUR NEGATIVE 06/30/2019 1730   Sepsis Labs: @LABRCNTIP (procalcitonin:4,lacticidven:4) ) Recent Results (from the past 240 hour(s))  SARS Coronavirus 2 by RT PCR (hospital order, performed in [facility] hospital lab) Nasopharyngeal Nasopharyngeal Swab     Status: None   Collection Time: 09/03/19 10:06 AM   Specimen: Nasopharyngeal Swab  Result Value Ref Range Status   SARS Coronavirus 2 NEGATIVE NEGATIVE Final    Comment: (NOTE) SARS-CoV-2 target nucleic acids are NOT  DETECTED. The SARS-CoV-2 RNA is generally detectable in upper and lower respiratory specimens during the acute phase of infection. The lowest concentration of SARS-CoV-2 viral copies this assay can detect is 250 copies / mL. A negative result does not preclude SARS-CoV-2 infection and should not be used as the sole basis for treatment or other patient management decisions.  A negative result may occur with improper specimen collection / handling, submission of specimen other than nasopharyngeal swab, presence of viral mutation(s) within the areas targeted by this assay, and inadequate number of viral copies (<250 copies / mL). A negative result must be combined with clinical observations, patient history, and epidemiological information. Fact Sheet for Patients:   11/03/19 Fact Sheet for Healthcare Providers: BoilerBrush.com.cy This test is not yet approved or cleared  by the https://pope.com/ FDA and has been authorized for detection  and/or diagnosis of SARS-CoV-2 by FDA under an Emergency Use Authorization (EUA).  This EUA will remain in effect (meaning this test can be used) for the duration of the COVID-19 declaration under Section 564(b)(1) of the Act, 21 U.S.C. section 360bbb-3(b)(1), unless the authorization is terminated or revoked sooner. Performed at [facility], [address], [place], [place] [address]   MRSA PCR Screening     Status: None   Collection Time: 09/03/19  5:09 PM   Specimen: Nasal Mucosa; Nasopharyngeal  Result Value Ref Range Status   MRSA by PCR NEGATIVE NEGATIVE Final    Comment:        The GeneXpert MRSA Assay (FDA approved for NASAL specimens only), is one component of a comprehensive MRSA colonization surveillance program. It is not intended to diagnose MRSA infection nor to guide or monitor treatment for MRSA infections. Performed at [facility], [address] [place], [place] [address]       Scheduled Meds: . carvedilol  25 mg Oral BID WC  . Chlorhexidine Gluconate Cloth  6 each Topical Daily  . enoxaparin (LOVENOX) injection  80 mg Subcutaneous Q24H  . furosemide  40 mg Intravenous Q12H  . ipratropium-albuterol  3 mL Nebulization TID  . nicotine  21 mg Transdermal Daily  . sodium chloride flush  3 mL Intravenous Q12H   Continuous Infusions: . sodium chloride    . niCARDipine 10 mg/hr (09/04/19 3299)    Procedures/Studies: DG Chest 2 View  Result Date: 08/12/2019 CLINICAL DATA:  Cough and shortness of breath. Lower extremity swelling. EXAM: CHEST - 2 VIEW COMPARISON:  Chest x-ray and CT chest dated June 30, 2019. FINDINGS: Unchanged cardiomegaly. Normal pulmonary vascularity. Low lung volumes are present, causing crowding of the pulmonary vasculature. Consolidation in the left lower lobe. Mild right basilar atelectasis. No pleural effusion or pneumothorax. No acute osseous abnormality. IMPRESSION: 1. Left lower lobe pneumonia. Electronically Signed   By: Titus Dubin M.D.   On: 08/12/2019 10:37   CT HEAD WO CONTRAST  Result Date: 09/03/2019 CLINICAL DATA:  Malignant hypertension. Acute pulmonary edema. Headaches. No reported injury. Inpatient. EXAM: CT HEAD WITHOUT CONTRAST TECHNIQUE: Contiguous axial images were obtained from the base of the skull through the vertex without intravenous contrast. COMPARISON:  08/13/2018 head CT. FINDINGS: Brain: No evidence of parenchymal hemorrhage or extra-axial fluid collection. No mass lesion, mass effect, or midline shift. No CT evidence of acute infarction. Cerebral volume is age appropriate. No ventriculomegaly. Vascular: No acute abnormality. Skull: No evidence of calvarial fracture. Chronic partially visualized left maxillary central incisor periapical lucency, not appreciably changed. Sinuses/Orbits: The visualized paranasal sinuses are essentially clear. Other:  The mastoid air cells are unopacified. IMPRESSION: No evidence of  acute intracranial abnormality. Chronic partially visualized left maxillary central incisor periapical lucency. Electronically Signed   By: Ilona Sorrel M.D.   On: 09/03/2019 20:21   DG Chest Port 1 View  Result Date: 09/03/2019 CLINICAL DATA:  Shortness of breath EXAM: PORTABLE CHEST 1 VIEW COMPARISON:  08/12/2019 FINDINGS: Stable mild cardiomegaly. Slightly decreased lung volumes. Prominent bilateral perihilar and bibasilar interstitial markings. No focal airspace consolidation is evident. No large pleural fluid collection. No pneumothorax. IMPRESSION: Findings suggestive of CHF with mild interstitial edema. Electronically Signed   By: Davina Poke D.O.   On: 09/03/2019 10:18    Orson Eva, DO  [facility]  If 7PM-7AM, please contact night-coverage [email or web address] Password [email or web address] 09/04/2019, 7:58 AM   LOS: 1 day

## 2019-09-04 NOTE — TOC Progression Note (Signed)
Transition of Care [facility]) - Progression Note   Patient Details  Name: Samantha Mccarthy MRN: [id number] Date of Birth: June 11, 1985  Transition of Care [facility]) CM/SW [place], LCSW Phone Number: 09/04/2019, 4:04 PM  Clinical Narrative: CSW spoke with Claiborne Billings from [employer] to see if CSW could hand deliver patient's paperwork for [facility] referral or if it needed to be mailed. Claiborne Billings confirmed CSW could give the paperwork directly to her. CSW met with patient in ICU to pick up [employer] paperwork and copies of her ID. Paperwork given to [employer] with [employer].  Expected Discharge Plan: Home/Self Care Barriers to Discharge: Continued Medical Work up  Expected Discharge Plan and Services Expected Discharge Plan: Home/Self Care In-house Referral: Clinical Social Work, Copywriter, advertising Acute Care Choice: NA Living arrangements for the past 2 months: Single Family Home            DME Arranged: N/A DME Agency: NA HH Arranged: NA [place] Agency: NA  Readmission Risk Interventions Readmission Risk Prevention Plan 09/03/2019  Transportation Screening Complete  PCP or Specialist Appt within 3-5 Days Complete  HRI or Home Care Consult Complete  Social Work Consult for [place] Planning/Counseling Complete  Palliative Care Screening Not Applicable  Medication Review Press photographer) Not Complete  Some recent data might be hidden

## 2019-09-04 NOTE — Progress Notes (Signed)
Patient switched over to large full face mask on cpap machine.

## 2019-09-04 NOTE — Plan of Care (Addendum)
Nutrition Education Note  RD consulted for nutrition education regarding new onset CHF.  RD provided "Low Sodium Nutrition Therapy" handout from the Academy of Nutrition and Dietetics. Reviewed patient's dietary recall. Provided examples on ways to decrease sodium intake in diet. Discouraged intake of processed foods and use of salt shaker. Encouraged fresh fruits and vegetables as well as whole grain sources of carbohydrates to maximize fiber intake.   RD discussed why it is important for patient to adhere to diet recommendations, and emphasized the role of fluids, foods to avoid, and importance of weighing self daily. Teach back method used.   Expect good compliance.  Body mass index is 73.6 kg/m. Pt meets criteria for morbid obesity based on current BMI.  Current diet order is 2 gram Na, patient is consuming approximately 100% of meals at this time. Labs and medications reviewed. No further nutrition interventions warranted at this time. RD contact information provided. If additional nutrition issues arise, please re-consult RD.   Lars Masson, RD, LDN Clinical Nutrition After Hours/Weekend Pager # in Amion

## 2019-09-04 NOTE — Progress Notes (Signed)
Switched over to auto BIPAP mode due to patient continuing to desat.

## 2019-09-05 LAB — CBC WITH DIFFERENTIAL/PLATELET
Abs Immature Granulocytes: 0.01 10*3/uL (ref 0.00–0.07)
Basophils Absolute: 0 10*3/uL (ref 0.0–0.1)
Basophils Relative: 0 %
Eosinophils Absolute: 0.2 10*3/uL (ref 0.0–0.5)
Eosinophils Relative: 3 %
HCT: 40.8 % (ref 36.0–46.0)
Hemoglobin: 12.2 g/dL (ref 12.0–15.0)
Immature Granulocytes: 0 %
Lymphocytes Relative: 29 %
Lymphs Abs: 2.1 10*3/uL (ref 0.7–4.0)
MCH: 23.6 pg — ABNORMAL LOW (ref 26.0–34.0)
MCHC: 29.9 g/dL — ABNORMAL LOW (ref 30.0–36.0)
MCV: 78.8 fL — ABNORMAL LOW (ref 80.0–100.0)
Monocytes Absolute: 0.8 10*3/uL (ref 0.1–1.0)
Monocytes Relative: 11 %
Neutro Abs: 4.1 10*3/uL (ref 1.7–7.7)
Neutrophils Relative %: 57 %
Platelets: 245 10*3/uL (ref 150–400)
RBC: 5.18 MIL/uL — ABNORMAL HIGH (ref 3.87–5.11)
RDW: 17.7 % — ABNORMAL HIGH (ref 11.5–15.5)
WBC: 7.2 10*3/uL (ref 4.0–10.5)
nRBC: 0 % (ref 0.0–0.2)

## 2019-09-05 LAB — COMPREHENSIVE METABOLIC PANEL
ALT: 23 U/L (ref 0–44)
AST: 11 U/L — ABNORMAL LOW (ref 15–41)
Albumin: 2.8 g/dL — ABNORMAL LOW (ref 3.5–5.0)
Alkaline Phosphatase: 62 U/L (ref 38–126)
Anion gap: 10 (ref 5–15)
BUN: 24 mg/dL — ABNORMAL HIGH (ref 6–20)
CO2: 29 mmol/L (ref 22–32)
Calcium: 8.4 mg/dL — ABNORMAL LOW (ref 8.9–10.3)
Chloride: 101 mmol/L (ref 98–111)
Creatinine, Ser: 1.72 mg/dL — ABNORMAL HIGH (ref 0.44–1.00)
GFR calc Af Amer: 44 mL/min — ABNORMAL LOW (ref >60)
GFR calc non Af Amer: 38 mL/min — ABNORMAL LOW (ref >60)
Glucose, Bld: 96 mg/dL (ref 70–99)
Potassium: 3.6 mmol/L (ref 3.5–5.1)
Sodium: 140 mmol/L (ref 135–145)
Total Bilirubin: 0.7 mg/dL (ref 0.3–1.2)
Total Protein: 6.5 g/dL (ref 6.5–8.1)

## 2019-09-05 LAB — MAGNESIUM: Magnesium: 1.8 mg/dL (ref 1.7–2.4)

## 2019-09-05 LAB — BRAIN NATRIURETIC PEPTIDE: B Natriuretic Peptide: 184 pg/mL — ABNORMAL HIGH (ref 0.0–100.0)

## 2019-09-05 MED ORDER — ISOSORBIDE MONONITRATE ER 60 MG PO TB24
30.0000 mg | ORAL_TABLET | Freq: Every day | ORAL | Status: DC
  Administered 2019-09-05 – 2019-09-06 (×2): 30 mg via ORAL
  Filled 2019-09-05 (×2): qty 1

## 2019-09-05 MED ORDER — HYDRALAZINE HCL 25 MG PO TABS
100.0000 mg | ORAL_TABLET | Freq: Three times a day (TID) | ORAL | Status: DC
  Administered 2019-09-05 – 2019-09-06 (×2): 100 mg via ORAL
  Filled 2019-09-05 (×2): qty 4

## 2019-09-05 MED ORDER — HYDRALAZINE HCL 25 MG PO TABS
50.0000 mg | ORAL_TABLET | Freq: Three times a day (TID) | ORAL | Status: DC
  Administered 2019-09-05: 50 mg via ORAL
  Filled 2019-09-05: qty 2

## 2019-09-05 MED ORDER — FUROSEMIDE 40 MG PO TABS
40.0000 mg | ORAL_TABLET | Freq: Every day | ORAL | Status: DC
  Administered 2019-09-06: 40 mg via ORAL
  Filled 2019-09-05: qty 1

## 2019-09-05 MED ORDER — HYDRALAZINE HCL 20 MG/ML IJ SOLN
10.0000 mg | Freq: Four times a day (QID) | INTRAMUSCULAR | Status: DC | PRN
  Administered 2019-09-05: 10 mg via INTRAVENOUS
  Filled 2019-09-05: qty 1

## 2019-09-05 MED ORDER — MAGNESIUM SULFATE 2 GM/50ML IV SOLN
2.0000 g | Freq: Once | INTRAVENOUS | Status: AC
  Administered 2019-09-05: 2 g via INTRAVENOUS
  Filled 2019-09-05: qty 50

## 2019-09-05 NOTE — Progress Notes (Signed)
PROGRESS NOTE  Samantha Mccarthy EXB:[id number] DOB: 1985/10/13 DOA: 09/03/2019 PCP: Health, [facility] Dept Personal   Brief History:  34 year old female with a history of systolic CHF (EF 10-27%), hypertension, OSA, gouty arthritis, tobacco abuse, morbid obesity presenting with 2-day history of chest discomfort, shortness of breath, and worsening lower extremity edema.  The patient had told of the providers that she had run out of her medications which she has been receiving from the [facility] department.  However, the patient tried to tell me that she had been 100% compliant with all her medications without any lapses in therapy.  Nevertheless, the patient was recently mated to the hospital from 06/30/2019 to 07/02/2019 for acute systolic CHF and acute on chronic renal failure.  During that hospital admission, the patient was also positive for cocaine.  She was discharged home with furosemide 20 mg daily.  Discharge weight at that time was 164.5 kg.  Upon presentation, the patient was noted to be hypoxic with oxygen saturation in the 80s.  The patient wason BiPAP and started on IV furosemide.  Chest x-ray showed interstitial edema.  The patient was also noted to have significant elevation of her blood pressure up to 243/108.  He was started on nicardipine drip.  Assessment/Plan: Acute on chronic systolic CHF -07/01/19 EchocardiogramEF 40-45%, global HK -Continue IV furosemide 40 mg bidduring hospitalization -Daily weights -Accurate I's and O's--NEG 7L -restart carvedilol -restart hydralazine--increase dose to 50 mg tid -add imdur  HTN emergency -Restart carvedilol--increased to 25 mg twice daily -Restart hydralazine--increase dose to 50 mg tid -add imdur -hesitate to use CCB due to low EF -Wean nicardipine drip for systolic blood pressure <180  Acute respiratory failure with hypoxia -Secondary to pulmonary edema -Initially placed on BiPAP -Now stable  on 2 L nasal cannula -Wean oxygen for saturation greater 92%  Cocaine abuse -pt states she has only used twice -avoiding BB without alpha blockade -repeat UDS--neg  CKD stage IIIa -Baseline creatinine 1.5-1.9 -Monitor with diuresis  Morbid obesity -BMI 62.30 -Lifestyle modification  Tobacco abuse -Tobacco cessation discussed     Status is: Inpatient  Remains inpatient appropriate because:Hemodynamically unstable--requiring nicardipine drip for HTN emergency; remains on IV lasix for fluid overload   Dispo: The patient is from: Home  Anticipated d/c is to: Home  Anticipated d/c date is: 1 days  Patient currently is not medically stable to d/c.         Family Communication:   No Family at bedside  Consultants:  none  Code Status:  FULL   DVT Prophylaxis:   [place] Lovenox   Procedures: As Listed in Progress Note Above  Antibiotics: None    Subjective: Pt is breathing better.  She still has some mild dyspnea on exertion.  Patient denies fevers, chills, headache, chest pain, dyspnea, nausea, vomiting, diarrhea, abdominal pain, dysuria, hematuria, hematochezia, and melena.   Objective: Vitals:   09/05/19 1315 09/05/19 1330 09/05/19 1345 09/05/19 1400  BP: (!) 176/95 (!) 182/85 (!) 181/106 (!) 204/118  Pulse: 72 80 76 74  Resp: (!) 27 (!) 23 (!) 27 12  Temp:      TempSrc:      SpO2: 98% 97% 96% 99%  Weight:      Height:        Intake/Output Summary (Last 24 hours) at 09/05/2019 1412 Last data filed at 09/05/2019 1005 Gross per 24 hour  Intake 727.82 ml  Output 3600 ml  Net -2872.18 ml   Weight change: 2.087 kg Exam:   General:  Pt is alert, follows commands appropriately, not in acute distress  HEENT: No icterus, No thrush, No neck mass, [place]/AT  Cardiovascular: RRR, S1/S2, no rubs, no gallops  Respiratory: fine bibasilar crackles. No wheeze  Abdomen: Soft/+BS, non tender, non  distended, no guarding  Extremities: 1 + LE edema, No lymphangitis, No petechiae, No rashes, no synovitis   Data Reviewed: I have personally reviewed following labs and imaging studies Basic Metabolic Panel: Recent Labs  Lab 09/03/19 1006 09/04/19 0457 09/05/19 0445  NA 140 139 140  K 4.0 3.6 3.6  CL 104 102 101  CO2 26 27 29   GLUCOSE 93 99 96  BUN 25* 23* 24*  CREATININE 1.95* 1.74* 1.72*  CALCIUM 8.3* 8.5* 8.4*  MG  --  1.7 1.8   Liver Function Tests: Recent Labs  Lab 09/04/19 0457 09/05/19 0445  AST 18 11*  ALT 31 23  ALKPHOS 68 62  BILITOT 0.8 0.7  PROT 6.7 6.5  ALBUMIN 3.0* 2.8*   No results for input(s): LIPASE, AMYLASE in the last 168 hours. No results for input(s): AMMONIA in the last 168 hours. Coagulation Profile: No results for input(s): INR, PROTIME in the last 168 hours. CBC: Recent Labs  Lab 09/03/19 1006 09/04/19 0457 09/05/19 0445  WBC 7.1 9.0 7.2  NEUTROABS 4.0 6.1 4.1  HGB 11.7* 11.9* 12.2  HCT 39.8 39.9 40.8  MCV 80.6 79.3* 78.8*  PLT 256 234 245   Cardiac Enzymes: No results for input(s): CKTOTAL, CKMB, CKMBINDEX, TROPONINI in the last 168 hours. BNP: Invalid input(s): POCBNP CBG: No results for input(s): GLUCAP in the last 168 hours. HbA1C: Recent Labs    09/03/19 1006  HGBA1C 6.3*   Urine analysis:    Component Value Date/Time   COLORURINE STRAW (A) 06/30/2019 1730   APPEARANCEUR CLEAR 06/30/2019 1730   LABSPEC 1.005 06/30/2019 1730   PHURINE 7.0 06/30/2019 1730   GLUCOSEU NEGATIVE 06/30/2019 1730   HGBUR NEGATIVE 06/30/2019 1730   BILIRUBINUR NEGATIVE 06/30/2019 1730   KETONESUR NEGATIVE 06/30/2019 1730   PROTEINUR NEGATIVE 06/30/2019 1730   UROBILINOGEN 1.0 06/30/2013 1835   NITRITE NEGATIVE 06/30/2019 1730   LEUKOCYTESUR NEGATIVE 06/30/2019 1730   Sepsis Labs: @LABRCNTIP (procalcitonin:4,lacticidven:4) ) Recent Results (from the past 240 hour(s))  SARS Coronavirus 2 by RT PCR (hospital order, performed in [facility] hospital lab) Nasopharyngeal Nasopharyngeal Swab     Status: None   Collection Time: 09/03/19 10:06 AM   Specimen: Nasopharyngeal Swab  Result Value Ref Range Status   SARS Coronavirus 2 NEGATIVE NEGATIVE Final    Comment: (NOTE) SARS-CoV-2 target nucleic acids are NOT DETECTED. The SARS-CoV-2 RNA is generally detectable in upper and lower respiratory specimens during the acute phase of infection. The lowest concentration of SARS-CoV-2 viral copies this assay can detect is 250 copies / mL. A negative result does not preclude SARS-CoV-2 infection and should not be used as the sole basis for treatment or other patient management decisions.  A negative result may occur with improper specimen collection / handling, submission of specimen other than nasopharyngeal swab, presence of viral mutation(s) within the areas targeted by this assay, and inadequate number of viral copies (<250 copies / mL). A negative result must be combined with clinical observations, patient history, and epidemiological information. Fact Sheet for Patients:   [facility] Fact Sheet for Healthcare Providers: 11/03/19 This test is not yet approved or cleared  by the [employer]  FDA and has been authorized for detection and/or diagnosis of SARS-CoV-2 by FDA under an Emergency Use Authorization (EUA).  This EUA will remain in effect (meaning this test can be used) for the duration of the COVID-19 declaration under Section 564(b)(1) of the Act, 21 U.S.C. section 360bbb-3(b)(1), unless the authorization is terminated or revoked sooner. Performed at [facility], [address], [place], [place] [address]   MRSA PCR Screening     Status: None   Collection Time: 09/03/19  5:09 PM   Specimen: Nasal Mucosa; Nasopharyngeal  Result Value Ref Range Status   MRSA by PCR NEGATIVE NEGATIVE Final    Comment:        The GeneXpert MRSA Assay (FDA approved  for NASAL specimens only), is one component of a comprehensive MRSA colonization surveillance program. It is not intended to diagnose MRSA infection nor to guide or monitor treatment for MRSA infections. Performed at [facility], [address], [place], [place] [address]      Scheduled Meds: . budesonide (PULMICORT) nebulizer solution  0.5 mg Nebulization BID  . carvedilol  25 mg Oral BID WC  . Chlorhexidine Gluconate Cloth  6 each Topical Daily  . enoxaparin (LOVENOX) injection  80 mg Subcutaneous Q24H  . furosemide  40 mg Intravenous Q12H  . hydrALAZINE  50 mg Oral Q8H  . ipratropium-albuterol  3 mL Nebulization Q6H  . isosorbide mononitrate  30 mg Oral Daily  . nicotine  21 mg Transdermal Daily  . sodium chloride flush  3 mL Intravenous Q12H   Continuous Infusions: . sodium chloride    . niCARDipine Stopped (09/05/19 0544)    Procedures/Studies: DG Chest 2 View  Result Date: 08/12/2019 CLINICAL DATA:  Cough and shortness of breath. Lower extremity swelling. EXAM: CHEST - 2 VIEW COMPARISON:  Chest x-ray and CT chest dated June 30, 2019. FINDINGS: Unchanged cardiomegaly. Normal pulmonary vascularity. Low lung volumes are present, causing crowding of the pulmonary vasculature. Consolidation in the left lower lobe. Mild right basilar atelectasis. No pleural effusion or pneumothorax. No acute osseous abnormality. IMPRESSION: 1. Left lower lobe pneumonia. Electronically Signed   By: Obie Dredge M.D.   On: 08/12/2019 10:37   CT HEAD WO CONTRAST  Result Date: 09/03/2019 CLINICAL DATA:  Malignant hypertension. Acute pulmonary edema. Headaches. No reported injury. Inpatient. EXAM: CT HEAD WITHOUT CONTRAST TECHNIQUE: Contiguous axial images were obtained from the base of the skull through the vertex without intravenous contrast. COMPARISON:  08/13/2018 head CT. FINDINGS: Brain: No evidence of parenchymal hemorrhage or extra-axial fluid collection. No mass lesion, mass effect, or  midline shift. No CT evidence of acute infarction. Cerebral volume is age appropriate. No ventriculomegaly. Vascular: No acute abnormality. Skull: No evidence of calvarial fracture. Chronic partially visualized left maxillary central incisor periapical lucency, not appreciably changed. Sinuses/Orbits: The visualized paranasal sinuses are essentially clear. Other:  The mastoid air cells are unopacified. IMPRESSION: No evidence of acute intracranial abnormality. Chronic partially visualized left maxillary central incisor periapical lucency. Electronically Signed   By: Delbert Phenix M.D.   On: 09/03/2019 20:21   DG Chest Port 1 View  Result Date: 09/04/2019 CLINICAL DATA:  Shortness of breath EXAM: PORTABLE CHEST 1 VIEW COMPARISON:  September 03, 2019 FINDINGS: No edema or airspace opacity. There is cardiomegaly with pulmonary vascularity normal. No adenopathy. No bone lesions. IMPRESSION: Cardiomegaly. Lungs clear. Pulmonary vascularity within normal limits. Electronically Signed   By: Bretta Bang III M.D.   On: 09/04/2019 07:58   DG Chest [facility]  1 View  Result Date: 09/03/2019 CLINICAL DATA:  Shortness of breath EXAM: PORTABLE CHEST 1 VIEW COMPARISON:  08/12/2019 FINDINGS: Stable mild cardiomegaly. Slightly decreased lung volumes. Prominent bilateral perihilar and bibasilar interstitial markings. No focal airspace consolidation is evident. No large pleural fluid collection. No pneumothorax. IMPRESSION: Findings suggestive of CHF with mild interstitial edema. Electronically Signed   By: Davina Poke D.O.   On: 09/03/2019 10:18    Orson Eva, DO  [facility]  If 7PM-7AM, please contact night-coverage [email or web address] Password [email or web address] 09/05/2019, 2:12 PM   LOS: 2 days

## 2019-09-05 NOTE — Progress Notes (Signed)
Report called and given to [employer], RN on 300. Pt to be transported to room 307 via WC.

## 2019-09-05 NOTE — Progress Notes (Signed)
Visited with patient, no family member was present. Patient expressed concern for spiritual support and prayer. Patient was very emotional during visit and she was looking forward to making change in her diet. Listened supportively and validated patient feelings and help her begin life review. Patient said, "I want to do better because of niece and boy friend." At the end of my visit, patient asked for prayer for strength and healing.

## 2019-09-05 NOTE — Progress Notes (Signed)
Fentanyl IV given for c/o frontal headache rated 8/10.

## 2019-09-05 NOTE — Progress Notes (Signed)
Pt Cardene gtt has been stopped since 0530 this morning. Blood pressure's have been relatively stable, only a few with systolic's over 180. Will continue to hold the gtt until MD see's the patient and evaluates. Will continue to monitor.

## 2019-09-06 DIAGNOSIS — Z720 Tobacco use: Secondary | ICD-10-CM

## 2019-09-06 LAB — CBC WITH DIFFERENTIAL/PLATELET
Abs Immature Granulocytes: 0.02 10*3/uL (ref 0.00–0.07)
Basophils Absolute: 0 10*3/uL (ref 0.0–0.1)
Basophils Relative: 0 %
Eosinophils Absolute: 0.3 10*3/uL (ref 0.0–0.5)
Eosinophils Relative: 4 %
HCT: 44.7 % (ref 36.0–46.0)
Hemoglobin: 13.5 g/dL (ref 12.0–15.0)
Immature Granulocytes: 0 %
Lymphocytes Relative: 30 %
Lymphs Abs: 2.2 10*3/uL (ref 0.7–4.0)
MCH: 23.5 pg — ABNORMAL LOW (ref 26.0–34.0)
MCHC: 30.2 g/dL (ref 30.0–36.0)
MCV: 77.7 fL — ABNORMAL LOW (ref 80.0–100.0)
Monocytes Absolute: 1.2 10*3/uL — ABNORMAL HIGH (ref 0.1–1.0)
Monocytes Relative: 17 %
Neutro Abs: 3.6 10*3/uL (ref 1.7–7.7)
Neutrophils Relative %: 49 %
Platelets: 239 10*3/uL (ref 150–400)
RBC: 5.75 MIL/uL — ABNORMAL HIGH (ref 3.87–5.11)
RDW: 18.1 % — ABNORMAL HIGH (ref 11.5–15.5)
WBC: 7.3 10*3/uL (ref 4.0–10.5)
nRBC: 0 % (ref 0.0–0.2)

## 2019-09-06 LAB — COMPREHENSIVE METABOLIC PANEL
ALT: 20 U/L (ref 0–44)
AST: 11 U/L — ABNORMAL LOW (ref 15–41)
Albumin: 3.1 g/dL — ABNORMAL LOW (ref 3.5–5.0)
Alkaline Phosphatase: 63 U/L (ref 38–126)
Anion gap: 14 (ref 5–15)
BUN: 26 mg/dL — ABNORMAL HIGH (ref 6–20)
CO2: 26 mmol/L (ref 22–32)
Calcium: 8.7 mg/dL — ABNORMAL LOW (ref 8.9–10.3)
Chloride: 97 mmol/L — ABNORMAL LOW (ref 98–111)
Creatinine, Ser: 1.61 mg/dL — ABNORMAL HIGH (ref 0.44–1.00)
GFR calc Af Amer: 48 mL/min — ABNORMAL LOW (ref >60)
GFR calc non Af Amer: 41 mL/min — ABNORMAL LOW (ref >60)
Glucose, Bld: 92 mg/dL (ref 70–99)
Potassium: 3.6 mmol/L (ref 3.5–5.1)
Sodium: 137 mmol/L (ref 135–145)
Total Bilirubin: 0.5 mg/dL (ref 0.3–1.2)
Total Protein: 7.1 g/dL (ref 6.5–8.1)

## 2019-09-06 LAB — BRAIN NATRIURETIC PEPTIDE: B Natriuretic Peptide: 151 pg/mL — ABNORMAL HIGH (ref 0.0–100.0)

## 2019-09-06 LAB — MAGNESIUM: Magnesium: 2.3 mg/dL (ref 1.7–2.4)

## 2019-09-06 MED ORDER — FUROSEMIDE 40 MG PO TABS: 40.0000 mg | ORAL_TABLET | Freq: Every day | ORAL | 1 refills | Status: AC

## 2019-09-06 MED ORDER — HYDRALAZINE HCL 100 MG PO TABS: 100.0000 mg | ORAL_TABLET | Freq: Three times a day (TID) | ORAL | 1 refills | Status: AC

## 2019-09-06 MED ORDER — CARVEDILOL 25 MG PO TABS: 25.0000 mg | ORAL_TABLET | Freq: Two times a day (BID) | ORAL | 1 refills | Status: AC

## 2019-09-06 MED ORDER — ISOSORBIDE MONONITRATE ER 30 MG PO TB24: 30.0000 mg | ORAL_TABLET | Freq: Every day | ORAL | 1 refills | Status: AC

## 2019-09-06 NOTE — Plan of Care (Signed)
Patient alert and oriented x 4 and her vitals were stable. Patient's iv was removed. Family present at the bedside. Reviewed all discharge instructions with patient who verbalized understanding. All questions answered. Patient was escorted down.

## 2019-09-06 NOTE — Plan of Care (Signed)
Patient discharged.

## 2019-09-06 NOTE — Discharge Summary (Signed)
Physician Discharge Summary  Samantha Mccarthy SEG:[id number] DOB: [birth year] DOA: 09/03/2019  PCP: [facility]  Admit date: 09/03/2019 Discharge date: 09/06/2019  Admitted From: Home Disposition:  Home   Recommendations for Outpatient Follow-up:  1. Follow up with PCP in 1-2 weeks 2. Please obtain BMP/CBC in one week     Discharge Condition: Stable CODE STATUS: FULL Diet recommendation: Heart Healthy   Brief/Interim Summary: 34 year old female with a history of systolic CHF(EF 73-71%), hypertension, OSA, gouty arthritis, tobacco abuse, morbid obesity presenting with 2-day history of chest discomfort, shortness of breath, and worsening lower extremity edema. The patient had told of the providers that she had run out of her medications which she has been receiving from the [facility]. However, the patient tried to tell me that she had been 100% compliant with all her medications without any lapses in therapy. Nevertheless, the patient was recently mated to the hospital from 06/30/2019 to 0/62/6948 for acute systolic CHF and acute on chronic renal failure. During that hospital admission, the patient was also positive for cocaine. She was discharged home with furosemide 20 mg daily. Discharge weight at that time was 164.5 kg. Upon presentation, the patient was noted to be hypoxicwith oxygen saturation in the 80s. The patient wason BiPAP and started on IV furosemide. Chest x-ray showed interstitial edema. The patient was also noted to have significant elevation of her blood pressure up to 243/108. He was started on nicardipine drip.  Discharge Diagnoses:   Acute on chronic systolic CHF -5/46/[id number] 40-45%, global HK -Continue IV furosemide 40 mgbidduring hospitalization -Daily weights -Accurate I's and O's--NEG 7L -restart carvedilol--increased to 25 mg bid -restart hydralazine--increase dose to 100 mg  tid -added imdur  HTNemergency -Restart carvedilol--increased to 25 mg twice daily -Restart hydralazine--increase dose to 100 mg tid -added imdur -hesitate to use CCB due to low EF -Wean nicardipine drip for systolic blood IRCVELFY<101  Acute respiratory failure with hypoxia -Secondary to pulmonary edema -Initially placed on BiPAP -Now stable on 2 L nasal cannula -Wean oxygen for saturation greater 92%  Cocaine abuse -pt states she has only used twice -avoiding BB without alpha blockade -repeat UDS--neg  CKD stage IIIa -Baseline creatinine 1.5-1.9 -Monitor with diuresis  Morbid obesity -BMI62.30 -Lifestyle modification  Tobacco abuse -Tobacco cessation discussed    Discharge Instructions   Allergies as of 09/06/2019      Reactions   Codeine Anaphylaxis   Hydrocodone-acetaminophen Shortness Of Breath, Itching, Swelling   Penicillins Hives   Has patient had a PCN reaction causing immediate rash, facial/tongue/throat swelling, SOB or lightheadedness with hypotension: No Has patient had a PCN reaction causing severe rash involving mucus membranes or skin necrosis: Yes Has patient had a PCN reaction that required hospitalization No Has patient had a PCN reaction occurring within the last 10 years: Yes If all of the above answers are "NO", then may proceed with Cephalosporin use.   Tylenol [acetaminophen] Hives      Medication List    STOP taking these medications   azithromycin 500 MG tablet Commonly known as: ZITHROMAX   cefdinir 300 MG capsule Commonly known as: OMNICEF   doxycycline 100 MG capsule Commonly known as: VIBRAMYCIN   traMADol 50 MG tablet Commonly known as: ULTRAM     TAKE these medications   albuterol 108 (90 Base) MCG/ACT inhaler Commonly known as: VENTOLIN HFA Inhale 2 puffs into the lungs every 4 (four) hours as needed for wheezing or shortness of breath (as needed  for shortness of breath).   carvedilol 25 MG  tablet Commonly known as: COREG Take 1 tablet (25 mg total) by mouth 2 (two) times daily with a meal. What changed:   medication strength  how much to take   furosemide 40 MG tablet Commonly known as: LASIX Take 1 tablet (40 mg total) by mouth daily. What changed:   medication strength  how much to take   hydrALAZINE 100 MG tablet Commonly known as: APRESOLINE Take 1 tablet (100 mg total) by mouth every 8 (eight) hours. What changed:   medication strength  how much to take  when to take this   hydrOXYzine 25 MG capsule Commonly known as: VISTARIL Take 25-50 mg by mouth every 6 (six) hours as needed.   isosorbide mononitrate 30 MG 24 hr tablet Commonly known as: IMDUR Take 1 tablet (30 mg total) by mouth daily.       Allergies  Allergen Reactions  . Codeine Anaphylaxis  . Hydrocodone-Acetaminophen Shortness Of Breath, Itching and Swelling  . Penicillins Hives    Has patient had a PCN reaction causing immediate rash, facial/tongue/throat swelling, SOB or lightheadedness with hypotension: No Has patient had a PCN reaction causing severe rash involving mucus membranes or skin necrosis: Yes Has patient had a PCN reaction that required hospitalization No Has patient had a PCN reaction occurring within the last 10 years: Yes If all of the above answers are "NO", then may proceed with Cephalosporin use.   . Tylenol [Acetaminophen] Hives    Consultations:  none   Procedures/Studies: DG Chest 2 View  Result Date: 08/12/2019 CLINICAL DATA:  Cough and shortness of breath. Lower extremity swelling. EXAM: CHEST - 2 VIEW COMPARISON:  Chest x-ray and CT chest dated June 30, 2019. FINDINGS: Unchanged cardiomegaly. Normal pulmonary vascularity. Low lung volumes are present, causing crowding of the pulmonary vasculature. Consolidation in the left lower lobe. Mild right basilar atelectasis. No pleural effusion or pneumothorax. No acute osseous abnormality. IMPRESSION: 1.  Left lower lobe pneumonia. Electronically Signed   By: Obie Dredge M.D.   On: 08/12/2019 10:37   CT HEAD WO CONTRAST  Result Date: 09/03/2019 CLINICAL DATA:  Malignant hypertension. Acute pulmonary edema. Headaches. No reported injury. Inpatient. EXAM: CT HEAD WITHOUT CONTRAST TECHNIQUE: Contiguous axial images were obtained from the base of the skull through the vertex without intravenous contrast. COMPARISON:  08/13/2018 head CT. FINDINGS: Brain: No evidence of parenchymal hemorrhage or extra-axial fluid collection. No mass lesion, mass effect, or midline shift. No CT evidence of acute infarction. Cerebral volume is age appropriate. No ventriculomegaly. Vascular: No acute abnormality. Skull: No evidence of calvarial fracture. Chronic partially visualized left maxillary central incisor periapical lucency, not appreciably changed. Sinuses/Orbits: The visualized paranasal sinuses are essentially clear. Other:  The mastoid air cells are unopacified. IMPRESSION: No evidence of acute intracranial abnormality. Chronic partially visualized left maxillary central incisor periapical lucency. Electronically Signed   By: Delbert Phenix M.D.   On: 09/03/2019 20:21   DG Chest Port 1 View  Result Date: 09/04/2019 CLINICAL DATA:  Shortness of breath EXAM: PORTABLE CHEST 1 VIEW COMPARISON:  September 03, 2019 FINDINGS: No edema or airspace opacity. There is cardiomegaly with pulmonary vascularity normal. No adenopathy. No bone lesions. IMPRESSION: Cardiomegaly. Lungs clear. Pulmonary vascularity within normal limits. Electronically Signed   By: Bretta Bang III M.D.   On: 09/04/2019 07:58   DG Chest Port 1 View  Result Date: 09/03/2019 CLINICAL DATA:  Shortness of breath EXAM: PORTABLE CHEST 1  VIEW COMPARISON:  08/12/2019 FINDINGS: Stable mild cardiomegaly. Slightly decreased lung volumes. Prominent bilateral perihilar and bibasilar interstitial markings. No focal airspace consolidation is evident. No large pleural fluid  collection. No pneumothorax. IMPRESSION: Findings suggestive of CHF with mild interstitial edema. Electronically Signed   By: Duanne GuessNicholas  Plundo D.O.   On: 09/03/2019 10:18         Discharge Exam: Vitals:   09/06/19 0738 09/06/19 0744  BP:    Pulse:    Resp:    Temp:    SpO2: 100% 100%   Vitals:   09/06/19 0400 09/06/19 0630 09/06/19 0738 09/06/19 0744  BP: (!) 168/94 (!) 170/100    Pulse: 81     Resp: 16     Temp: 98 F (36.7 C)     TempSrc: Oral     SpO2: 94%  100% 100%  Weight:      Height:        General: Pt is alert, awake, not in acute distress Cardiovascular: RRR, S1/S2 +, no rubs, no gallops Respiratory: CTA bilaterally, no wheezing, no rhonchi Abdominal: Soft, NT, ND, bowel sounds + Extremities: trace LE edema, no cyanosis   The results of significant diagnostics from this hospitalization (including imaging, microbiology, ancillary and laboratory) are listed below for reference.    Significant Diagnostic Studies: DG Chest 2 View  Result Date: 08/12/2019 CLINICAL DATA:  Cough and shortness of breath. Lower extremity swelling. EXAM: CHEST - 2 VIEW COMPARISON:  Chest x-ray and CT chest dated June 30, 2019. FINDINGS: Unchanged cardiomegaly. Normal pulmonary vascularity. Low lung volumes are present, causing crowding of the pulmonary vasculature. Consolidation in the left lower lobe. Mild right basilar atelectasis. No pleural effusion or pneumothorax. No acute osseous abnormality. IMPRESSION: 1. Left lower lobe pneumonia. Electronically Signed   By: Obie DredgeWilliam T Derry M.D.   On: 08/12/2019 10:37   CT HEAD WO CONTRAST  Result Date: 09/03/2019 CLINICAL DATA:  Malignant hypertension. Acute pulmonary edema. Headaches. No reported injury. Inpatient. EXAM: CT HEAD WITHOUT CONTRAST TECHNIQUE: Contiguous axial images were obtained from the base of the skull through the vertex without intravenous contrast. COMPARISON:  08/13/2018 head CT. FINDINGS: Brain: No evidence of  parenchymal hemorrhage or extra-axial fluid collection. No mass lesion, mass effect, or midline shift. No CT evidence of acute infarction. Cerebral volume is age appropriate. No ventriculomegaly. Vascular: No acute abnormality. Skull: No evidence of calvarial fracture. Chronic partially visualized left maxillary central incisor periapical lucency, not appreciably changed. Sinuses/Orbits: The visualized paranasal sinuses are essentially clear. Other:  The mastoid air cells are unopacified. IMPRESSION: No evidence of acute intracranial abnormality. Chronic partially visualized left maxillary central incisor periapical lucency. Electronically Signed   By: Delbert PhenixJason A Poff M.D.   On: 09/03/2019 20:21   DG Chest Port 1 View  Result Date: 09/04/2019 CLINICAL DATA:  Shortness of breath EXAM: PORTABLE CHEST 1 VIEW COMPARISON:  September 03, 2019 FINDINGS: No edema or airspace opacity. There is cardiomegaly with pulmonary vascularity normal. No adenopathy. No bone lesions. IMPRESSION: Cardiomegaly. Lungs clear. Pulmonary vascularity within normal limits. Electronically Signed   By: Bretta BangWilliam  Woodruff III M.D.   On: 09/04/2019 07:58   DG Chest Port 1 View  Result Date: 09/03/2019 CLINICAL DATA:  Shortness of breath EXAM: PORTABLE CHEST 1 VIEW COMPARISON:  08/12/2019 FINDINGS: Stable mild cardiomegaly. Slightly decreased lung volumes. Prominent bilateral perihilar and bibasilar interstitial markings. No focal airspace consolidation is evident. No large pleural fluid collection. No pneumothorax. IMPRESSION: Findings suggestive of CHF with mild interstitial edema.  Electronically Signed   By: Duanne Guess D.O.   On: 09/03/2019 10:18     Microbiology: Recent Results (from the past 240 hour(s))  SARS Coronavirus 2 by RT PCR (hospital order, performed in [facility] hospital lab) Nasopharyngeal Nasopharyngeal Swab     Status: None   Collection Time: 09/03/19 10:06 AM   Specimen: Nasopharyngeal Swab  Result Value Ref Range  Status   SARS Coronavirus 2 NEGATIVE NEGATIVE Final    Comment: (NOTE) SARS-CoV-2 target nucleic acids are NOT DETECTED. The SARS-CoV-2 RNA is generally detectable in upper and lower respiratory specimens during the acute phase of infection. The lowest concentration of SARS-CoV-2 viral copies this assay can detect is 250 copies / mL. A negative result does not preclude SARS-CoV-2 infection and should not be used as the sole basis for treatment or other patient management decisions.  A negative result may occur with improper specimen collection / handling, submission of specimen other than nasopharyngeal swab, presence of viral mutation(s) within the areas targeted by this assay, and inadequate number of viral copies (<250 copies / mL). A negative result must be combined with clinical observations, patient history, and epidemiological information. Fact Sheet for Patients:   BoilerBrush.com.cy Fact Sheet for Healthcare Providers: https://pope.com/ This test is not yet approved or cleared  by the [place] FDA and has been authorized for detection and/or diagnosis of SARS-CoV-2 by FDA under an Emergency Use Authorization (EUA).  This EUA will remain in effect (meaning this test can be used) for the duration of the COVID-19 declaration under Section 564(b)(1) of the Act, 21 U.S.C. section 360bbb-3(b)(1), unless the authorization is terminated or revoked sooner. Performed at [facility], [address] [place], [place] [address]   MRSA PCR Screening     Status: None   Collection Time: 09/03/19  5:09 PM   Specimen: Nasal Mucosa; Nasopharyngeal  Result Value Ref Range Status   MRSA by PCR NEGATIVE NEGATIVE Final    Comment:        The GeneXpert MRSA Assay (FDA approved for NASAL specimens only), is one component of a comprehensive MRSA colonization surveillance program. It is not intended to diagnose MRSA infection nor to guide  or monitor treatment for MRSA infections. Performed at [facility], [address] [place], [place] [address]      Labs: Basic Metabolic Panel: Recent Labs  Lab 09/03/19 1006 09/03/19 1006 09/04/19 0457 09/04/19 0457 09/05/19 0445 09/06/19 0529  NA 140  --  139  --  140 137  K 4.0   < > 3.6   < > 3.6 3.6  CL 104  --  102  --  101 97*  CO2 26  --  27  --  29 26  GLUCOSE 93  --  99  --  96 92  BUN 25*  --  23*  --  24* 26*  CREATININE 1.95*  --  1.74*  --  1.72* 1.61*  CALCIUM 8.3*  --  8.5*  --  8.4* 8.7*  MG  --   --  1.7  --  1.8 2.3   < > = values in this interval not displayed.   Liver Function Tests: Recent Labs  Lab 09/04/19 0457 09/05/19 0445 09/06/19 0529  AST 18 11* 11*  ALT 31 23 20   ALKPHOS 68 62 63  BILITOT 0.8 0.7 0.5  PROT 6.7 6.5 7.1  ALBUMIN 3.0* 2.8* 3.1*   No results for input(s): LIPASE, AMYLASE in the last 168 hours.  No results for input(s): AMMONIA in the last 168 hours. CBC: Recent Labs  Lab 09/03/19 1006 09/04/19 0457 09/05/19 0445 09/06/19 0529  WBC 7.1 9.0 7.2 7.3  NEUTROABS 4.0 6.1 4.1 3.6  HGB 11.7* 11.9* 12.2 13.5  HCT 39.8 39.9 40.8 44.7  MCV 80.6 79.3* 78.8* 77.7*  PLT 256 234 245 239   Cardiac Enzymes: No results for input(s): CKTOTAL, CKMB, CKMBINDEX, TROPONINI in the last 168 hours. BNP: Invalid input(s): POCBNP CBG: No results for input(s): GLUCAP in the last 168 hours.  Time coordinating discharge:  36 minutes  Signed:  Catarina Hartshorn, DO [facility] Pager: [phone] 09/06/2019, 9:33 AM

## 2020-05-14 ENCOUNTER — Other Ambulatory Visit: Payer: Self-pay | Admitting: Nephrology

## 2020-05-14 DIAGNOSIS — F17209 Nicotine dependence, unspecified, with unspecified nicotine-induced disorders: Secondary | ICD-10-CM

## 2020-05-14 DIAGNOSIS — E668 Other obesity: Secondary | ICD-10-CM

## 2020-05-14 DIAGNOSIS — E6689 Other obesity not elsewhere classified: Secondary | ICD-10-CM

## 2020-05-14 DIAGNOSIS — N1832 Chronic kidney disease, stage 3b: Secondary | ICD-10-CM

## 2020-05-14 DIAGNOSIS — R809 Proteinuria, unspecified: Secondary | ICD-10-CM

## 2020-05-14 DIAGNOSIS — R82998 Other abnormal findings in urine: Secondary | ICD-10-CM

## 2020-05-14 DIAGNOSIS — I10 Essential (primary) hypertension: Secondary | ICD-10-CM

## 2020-05-14 DIAGNOSIS — E7800 Pure hypercholesterolemia, unspecified: Secondary | ICD-10-CM

## 2020-05-21 ENCOUNTER — Ambulatory Visit: Payer: Medicaid Other

## 2020-06-03 ENCOUNTER — Ambulatory Visit
Admission: RE | Admit: 2020-06-03 | Discharge: 2020-06-03 | Disposition: A | Discharge status: Home / Self Care | Payer: Medicaid Other | Source: Ambulatory Visit | Attending: Nephrology | Admitting: Nephrology

## 2020-06-03 DIAGNOSIS — N1832 Chronic kidney disease, stage 3b: Secondary | ICD-10-CM

## 2020-06-03 DIAGNOSIS — R809 Proteinuria, unspecified: Secondary | ICD-10-CM

## 2020-06-03 DIAGNOSIS — E7800 Pure hypercholesterolemia, unspecified: Secondary | ICD-10-CM

## 2020-06-03 DIAGNOSIS — E668 Other obesity: Secondary | ICD-10-CM

## 2020-06-03 DIAGNOSIS — I10 Essential (primary) hypertension: Secondary | ICD-10-CM

## 2020-06-03 DIAGNOSIS — F17209 Nicotine dependence, unspecified, with unspecified nicotine-induced disorders: Secondary | ICD-10-CM

## 2020-06-03 DIAGNOSIS — R82998 Other abnormal findings in urine: Secondary | ICD-10-CM

## 2020-08-03 ENCOUNTER — Ambulatory Visit: Payer: Medicaid Other | Admitting: Cardiology

## 2020-08-27 ENCOUNTER — Ambulatory Visit: Payer: Medicaid Other | Admitting: Cardiology

## 2020-12-08 IMAGING — CT CT HEAD W/O CM
3 series · 15 of 47 positions shown, 18 images · non-contrast
Comparison: 08/13/2018 head CT.

CLINICAL DATA: Malignant hypertension. Acute pulmonary edema.
Headaches. No reported injury. Inpatient.

EXAM:
CT HEAD WITHOUT CONTRAST
TECHNIQUE: Contiguous axial images were obtained from the base of the skull
through the vertex without intravenous contrast.

[Series 2: head w o · axial · 0.43mm/px · z∈[+1581,+1706]mm · 9 of 31 slices shown, 12 images]
[im 3/31  brain]
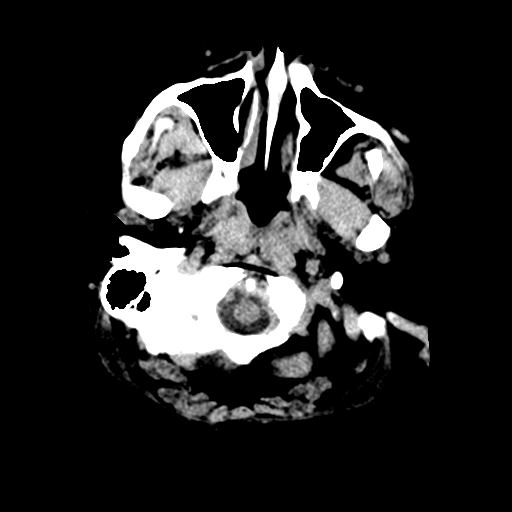
[im 3/31  bone]
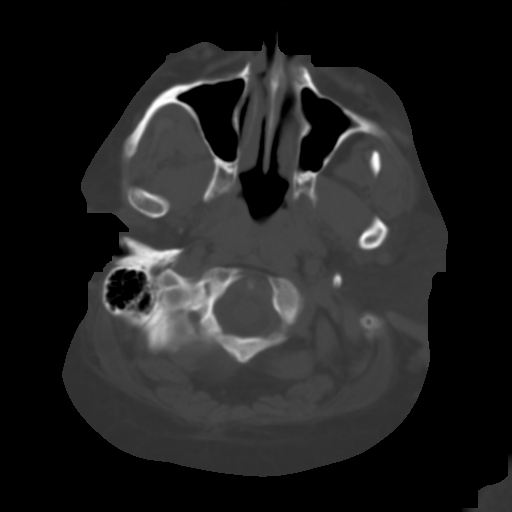
[im 6/31  brain]
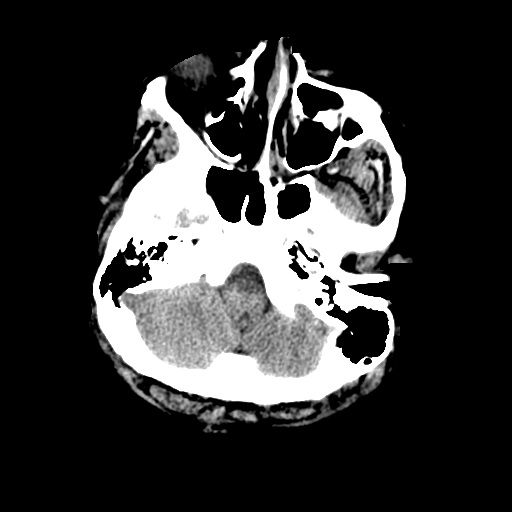
[im 9/31  brain]
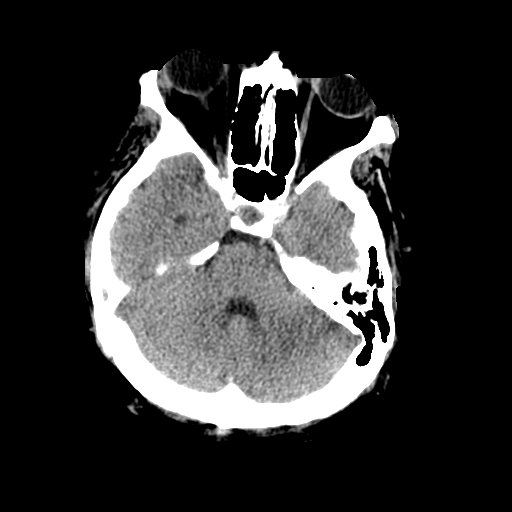
[im 12/31  brain]
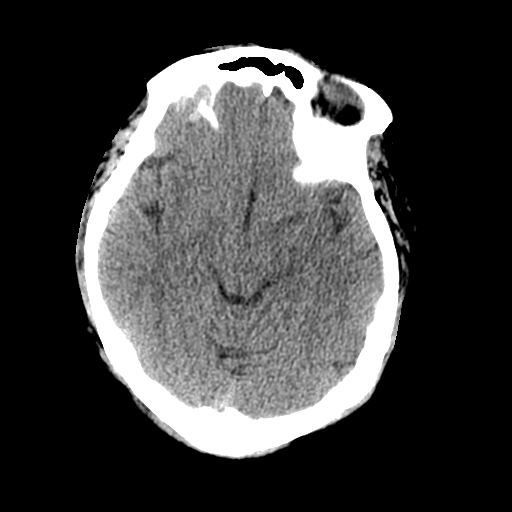
[im 16/31  brain]
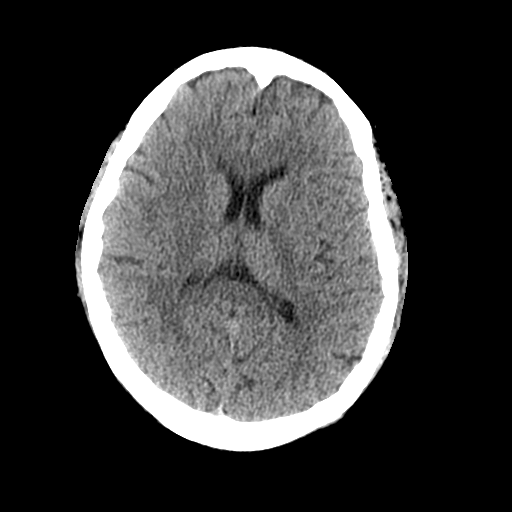
[im 16/31  bone]
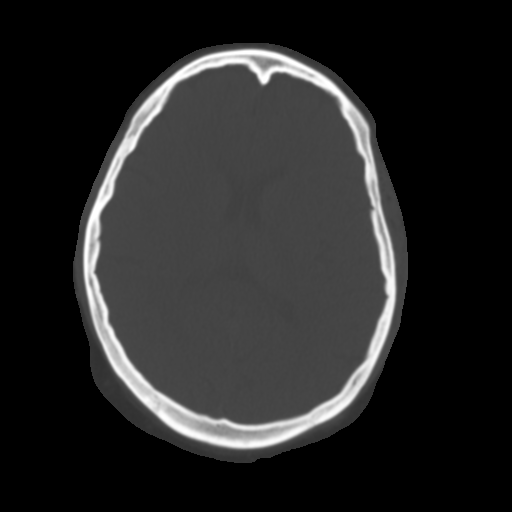
[im 19/31  brain]
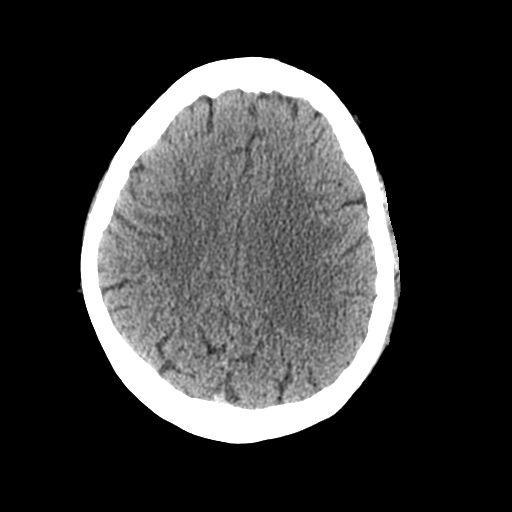
[im 22/31  brain]
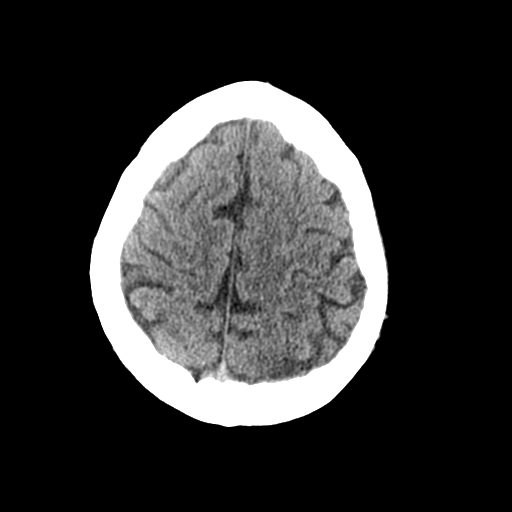
[im 25/31  brain]
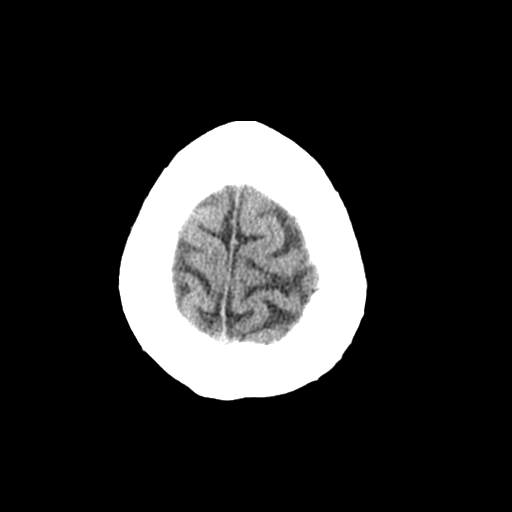
[im 28/31  brain]
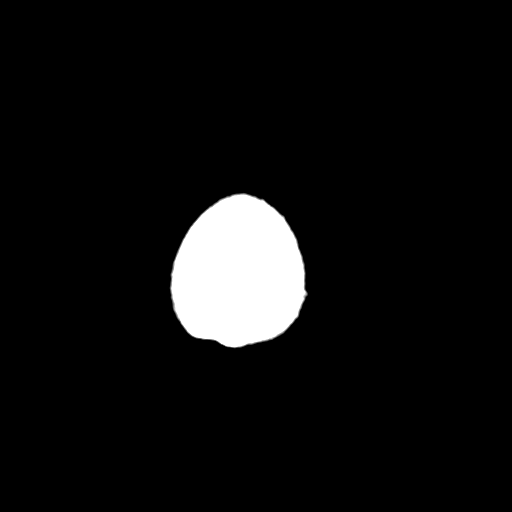
[im 28/31  bone]
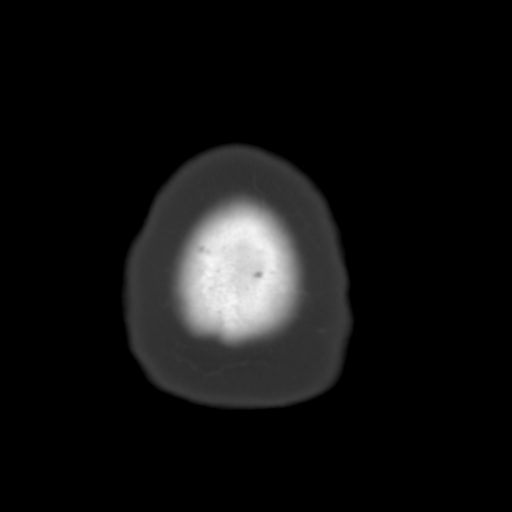

[Series 4: coronal soft · coronal · 0.29mm/px · 3 of 72 slices shown]
[im 24/72  brain]
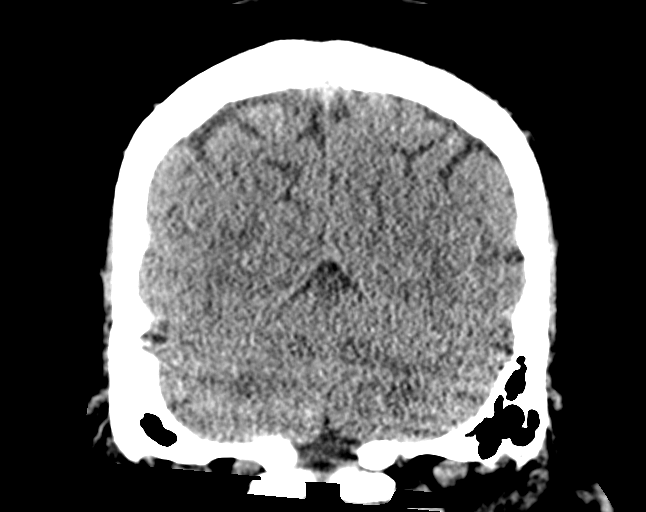
[im 32/72  brain]
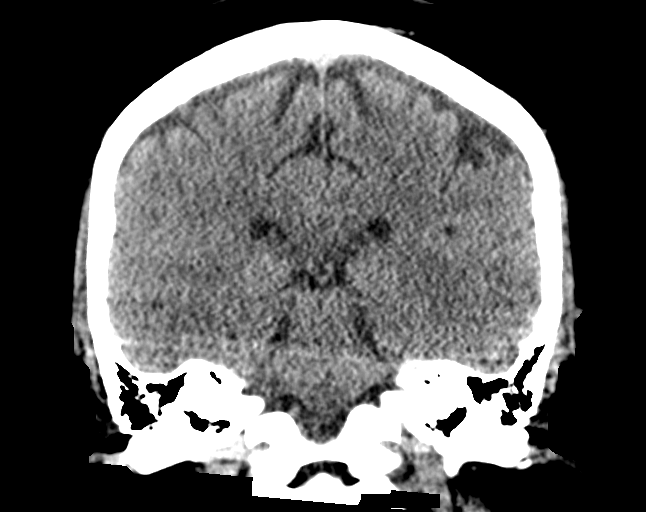
[im 40/72  brain]
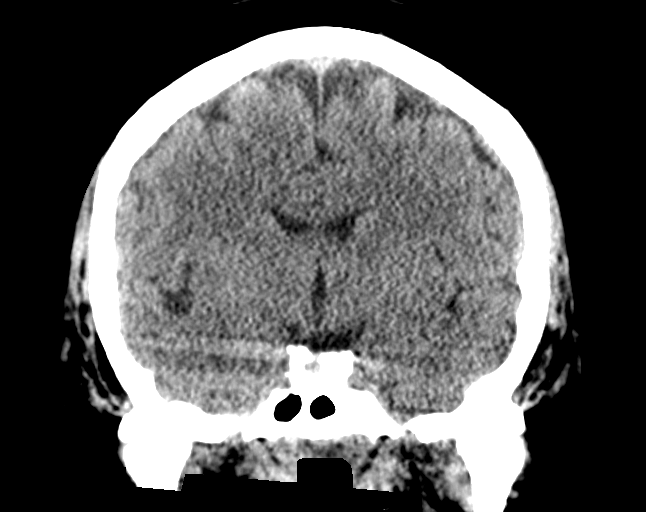

[Series 5: sagittal soft · sagittal · 0.29mm/px · 3 of 63 slices shown]
[im 21/63  brain]
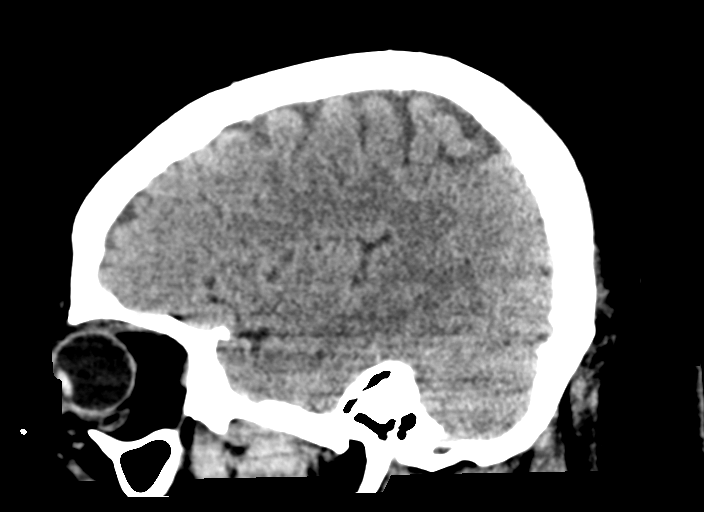
[im 32/63  brain]
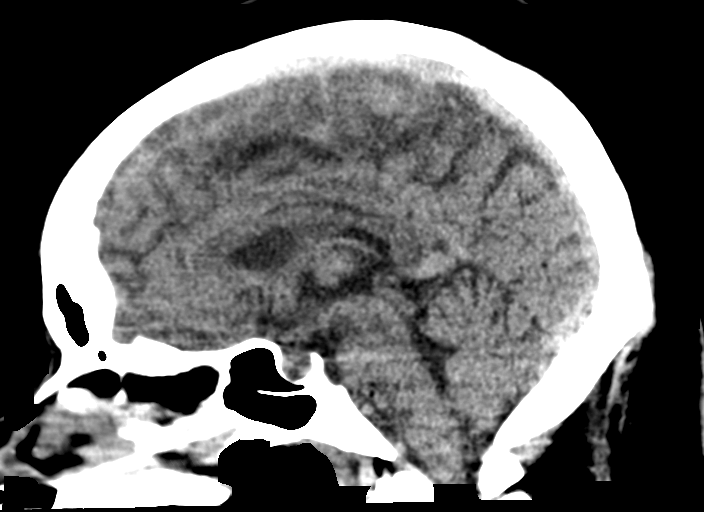
[im 42/63  brain]
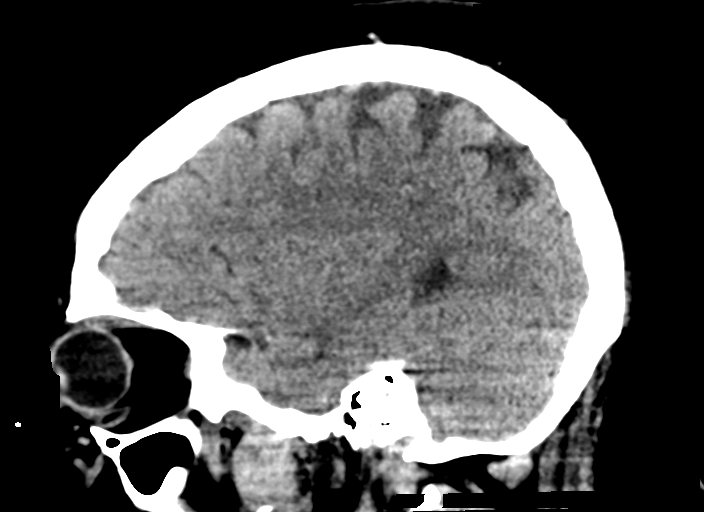

[15 of 47 positions shown; findings below may reference images not displayed]

FINDINGS: Brain: No evidence of parenchymal hemorrhage or extra-axial fluid
collection. No mass lesion, mass effect, or midline shift. No CT
evidence of acute infarction. Cerebral volume is age appropriate. No
ventriculomegaly.

Vascular: No acute abnormality.

Skull: No evidence of calvarial fracture. Chronic partially
visualized left maxillary central incisor periapical lucency, not
appreciably changed.

Sinuses/Orbits: The visualized paranasal sinuses are essentially
clear.

Other:  The mastoid air cells are unopacified.
IMPRESSION: No evidence of acute intracranial abnormality.

Chronic partially visualized left maxillary central incisor
periapical lucency.

## 2020-12-09 IMAGING — DX DG CHEST 1V PORT
1 series · 1 of 1 positions shown · non-contrast
Comparison: September 03, 2019

CLINICAL DATA: Shortness of breath

EXAM:
PORTABLE CHEST 1 VIEW

[chest ap grid]
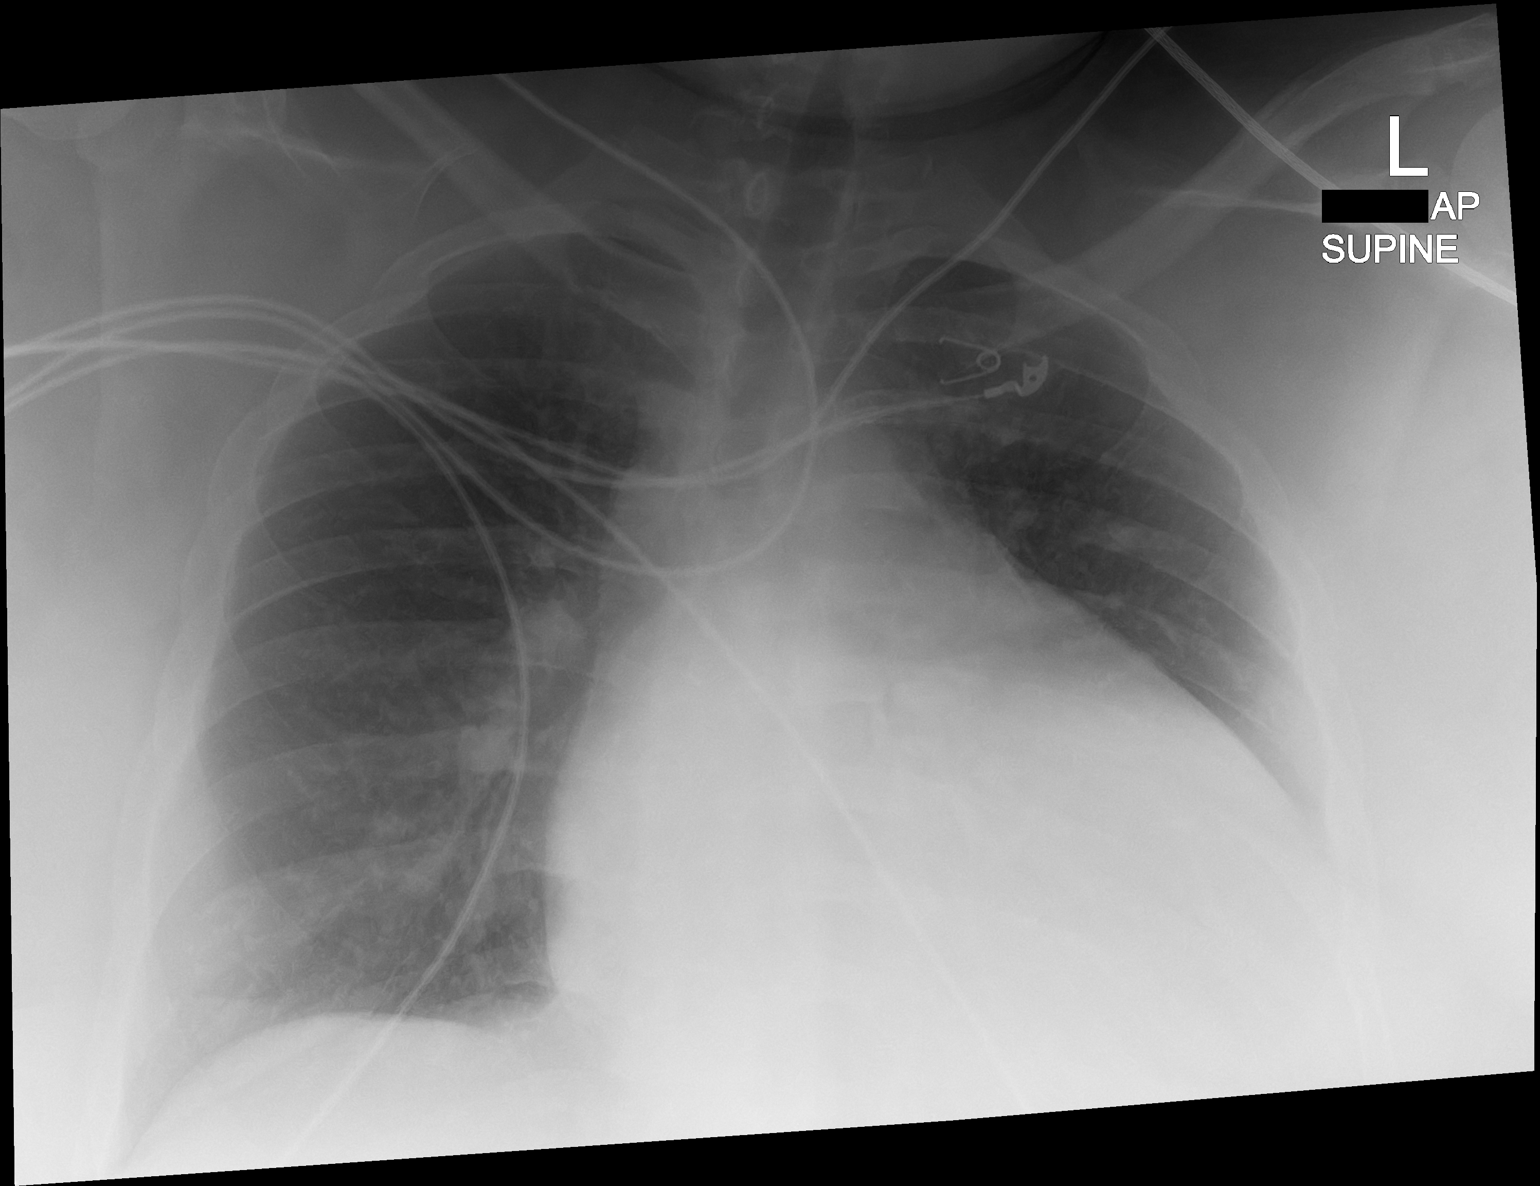

[1 of 1 positions shown; findings below may reference images not displayed]

FINDINGS: No edema or airspace opacity. There is cardiomegaly with pulmonary
vascularity normal. No adenopathy. No bone lesions.
IMPRESSION: Cardiomegaly. Lungs clear. Pulmonary vascularity within normal
limits.
